# Patient Record
Sex: Female | Born: 1995 | Race: Black or African American | Hispanic: No | Marital: Single | Smoking: Never smoker
Health system: Southern US, Community
[De-identification: ages and names within clinical notes are randomized; demographics above are authoritative.]

## PROBLEM LIST (undated history)

## (undated) DIAGNOSIS — T7840XA Allergy, unspecified, initial encounter: Secondary | ICD-10-CM

## (undated) HISTORY — DX: Allergy, unspecified, initial encounter: T78.40XA

## (undated) HISTORY — PX: APPENDECTOMY: SHX54

---
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
## 2013-02-20 ENCOUNTER — Encounter: Payer: Self-pay | Admitting: Internal Medicine

## 2013-02-20 ENCOUNTER — Ambulatory Visit (INDEPENDENT_AMBULATORY_CARE_PROVIDER_SITE_OTHER): Payer: BC Managed Care – PPO | Admitting: Internal Medicine

## 2013-02-20 VITALS — BP 110/70 | HR 70 | Temp 98.0°F | Resp 18 | Wt 163.0 lb

## 2013-02-20 DIAGNOSIS — B009 Herpesviral infection, unspecified: Secondary | ICD-10-CM

## 2013-02-20 DIAGNOSIS — B001 Herpesviral vesicular dermatitis: Secondary | ICD-10-CM

## 2013-02-20 DIAGNOSIS — L089 Local infection of the skin and subcutaneous tissue, unspecified: Secondary | ICD-10-CM | POA: Insufficient documentation

## 2013-02-20 MED ORDER — DOXYCYCLINE HYCLATE 100 MG PO TABS: 100.0000 mg | ORAL_TABLET | Freq: Two times a day (BID) | ORAL | Status: DC

## 2013-02-20 MED ORDER — VALACYCLOVIR HCL 1 G PO TABS: 1000.0000 mg | ORAL_TABLET | Freq: Two times a day (BID) | ORAL | Status: DC

## 2013-02-20 NOTE — Progress Notes (Signed)
Pre-visit discussion using our clinic review tool. No additional management support is needed unless otherwise documented below in the visit note.  

## 2013-02-20 NOTE — Patient Instructions (Signed)
Please take all new medication as prescribed - the two antibiotics (valtrex, and doxycycline) Please continue all other medications as before, and refills have been done if requested. Please have the pharmacy call with any other refills you may need.  Please remember to sign up for My Chart if you have not done so, as this will be important to you in the future with finding out test results, communicating by private email, and scheduling acute appointments online when needed.

## 2013-02-21 NOTE — Assessment & Plan Note (Signed)
Mild to mod, for antibx course,  to f/u any worsening symptoms or concerns 

## 2013-02-21 NOTE — Progress Notes (Signed)
   Subjective:    Patient ID: Lauren Mcknight, female    DOB: 06/06/1995, 18 y.o.   MRN: [id number]  HPI  Here with 3 days onset several pustules right cheek tender with one small drained, no fever, HA, ST, cough and Pt denies chest pain, increased sob or doe, wheezing, orthopnea, PND, increased LE swelling, palpitations, dizziness or syncope.  Also recently with several new tingly vesicular cold sore lesions to right corner of mouth as well. Pt denies new neurological symptoms such as new headache, or facial or extremity weakness or numbness  Pt denies polydipsia, polyuria. Past Medical History  Diagnosis Date  . ALLERGIC REACTION 07/20/2008    Qualifier: Diagnosis of  By: Shary DecampHawks, Stacia    . Allergy    Past Surgical History  Procedure Laterality Date  . Breast lumpectomy  2011    reports that she has never smoked. She has never used smokeless tobacco. She reports that she does not drink alcohol or use illicit drugs. family history includes Diabetes in her mother. There is no history of Alcohol abuse, Cancer, Heart disease, Hyperlipidemia, Hypertension, or Stroke. No Known Allergies Current Outpatient Prescriptions on File Prior to Visit  Medication Sig Dispense Refill  . clotrimazole-betamethasone (LOTRISONE) cream Use as directed twice per day as needed  15 g  1  . EPINEPHrine (EPI-PEN) 0.3 mg/0.3 mL DEVI Inject 0.3 mLs (0.3 mg total) into the muscle once.  1 Device  1  . etonogestrel-ethinyl estradiol (NUVARING) 0.12-0.015 MG/24HR vaginal ring Place 1 each vaginally every 28 (twenty-eight) days. Insert vaginally and leave in place for 3 consecutive weeks, then remove for 1 week.       No current facility-administered medications on file prior to visit.   Review of Systems  Constitutional: Negative for unexpected weight change, or unusual diaphoresis  HENT: Negative for tinnitus.   Eyes: Negative for photophobia and visual disturbance.  Respiratory: Negative for choking and stridor.     Gastrointestinal: Negative for vomiting and blood in stool.  Genitourinary: Negative for hematuria and decreased urine volume.  Musculoskeletal: Negative for acute joint swelling Skin: Negative for color change and wound.  Neurological: Negative for tremors and numbness other than noted  Psychiatric/Behavioral: Negative for decreased concentration or  hyperactivity.       Objective:   Physical Exam BP 110/70  Pulse 70  Temp(Src) 98 F (36.7 C) (Oral)  Resp 18  Wt 163 lb (73.936 kg)  SpO2 99% VS noted, mild ill Constitutional: Pt appears well-developed and well-nourished.  HENT: Head: NCAT.  Right Ear: External ear normal.  Left Ear: External ear normal.  Eyes: Conjunctivae and EOM are normal. Pupils are equal, round, and reactive to light.  Neck: Normal range of motion. Neck supple.  Cardiovascular: Normal rate and regular rhythm.   Pulmonary/Chest: Effort normal and breath sounds normal.  Neurological: Pt is alert. Not confused  Skin: right cheek with two 1/2 cm each areas pustulonodular tender without fluctuance or drainage, also several grouped cold sores vesiclular to right corner of mouth Psychiatric: Pt behavior is normal. Thought content normal.     Assessment & Plan:

## 2013-03-31 ENCOUNTER — Ambulatory Visit (INDEPENDENT_AMBULATORY_CARE_PROVIDER_SITE_OTHER)
Admission: RE | Admit: 2013-03-31 | Discharge: 2013-03-31 | Disposition: A | Discharge status: Home / Self Care | Payer: BC Managed Care – PPO | Source: Ambulatory Visit | Attending: Internal Medicine | Admitting: Internal Medicine

## 2013-03-31 ENCOUNTER — Encounter: Payer: Self-pay | Admitting: Internal Medicine

## 2013-03-31 ENCOUNTER — Ambulatory Visit (INDEPENDENT_AMBULATORY_CARE_PROVIDER_SITE_OTHER): Payer: BC Managed Care – PPO | Admitting: Internal Medicine

## 2013-03-31 VITALS — BP 112/80 | HR 97 | Temp 98.6°F | Ht 65.5 in | Wt 166.1 lb

## 2013-03-31 DIAGNOSIS — M25579 Pain in unspecified ankle and joints of unspecified foot: Secondary | ICD-10-CM

## 2013-03-31 DIAGNOSIS — M25572 Pain in left ankle and joints of left foot: Secondary | ICD-10-CM

## 2013-03-31 MED ORDER — NAPROXEN 500 MG PO TABS: 500.0000 mg | ORAL_TABLET | Freq: Two times a day (BID) | ORAL | Status: DC

## 2013-03-31 NOTE — Assessment & Plan Note (Signed)
C/w sprain, cant r/o fx - for naproxen prn, check film - r/o fx

## 2013-03-31 NOTE — Patient Instructions (Signed)
Please take all new medication as prescribed Please continue all other medications as before, and refills have been done if requested.  Please go to the XRAY Department in the Basement (go straight as you get off the elevator) for the x-ray testing You will be contacted by phone if any changes need to be made immediately.  Otherwise, you will receive a letter about your results with an explanation, but please check with MyChart first.

## 2013-03-31 NOTE — Progress Notes (Signed)
   Subjective:    Patient ID: Lauren Mcknight, female    DOB: 03/25/1995, 18 y.o.   MRN: [id number]  HPI Here to f/u, unfort was walking in a yard in high heels and turned left ankle, with foot inversion 1 wk ago, but with persistent pain, swelling, and some bruising persisting, just not seeming to get better, despite initial icing. Past Medical History  Diagnosis Date  . ALLERGIC REACTION 07/20/2008    Qualifier: Diagnosis of  By: Shary DecampHawks, Stacia    . Allergy    Past Surgical History  Procedure Laterality Date  . Breast lumpectomy  2011    reports that she has never smoked. She has never used smokeless tobacco. She reports that she does not drink alcohol or use illicit drugs. family history includes Diabetes in her mother. There is no history of Alcohol abuse, Cancer, Heart disease, Hyperlipidemia, Hypertension, or Stroke. No Known Allergies Current Outpatient Prescriptions on File Prior to Visit  Medication Sig Dispense Refill  . etonogestrel-ethinyl estradiol (NUVARING) 0.12-0.015 MG/24HR vaginal ring Place 1 each vaginally every 28 (twenty-eight) days. Insert vaginally and leave in place for 3 consecutive weeks, then remove for 1 week.      . clotrimazole-betamethasone (LOTRISONE) cream Use as directed twice per day as needed  15 g  1  . EPINEPHrine (EPI-PEN) 0.3 mg/0.3 mL DEVI Inject 0.3 mLs (0.3 mg total) into the muscle once.  1 Device  1  . valACYclovir (VALTREX) 1000 MG tablet Take 1 tablet (1,000 mg total) by mouth 2 (two) times daily.  20 tablet  0   No current facility-administered medications on file prior to visit.   Review of Systems All otherwise neg per pt     Objective:   Physical Exam BP 112/80  Pulse 97  Temp(Src) 98.6 F (37 C) (Oral)  Ht 5' 5.5" (1.664 m)  Wt 166 lb 2 oz (75.354 kg)  BMI 27.21 kg/m2  SpO2 97%  LMP 03/16/2013 VS noted, not ill appearing Constitutional: Pt appears well-developed and well-nourished. Lavella Lemons/obese HENT: Head: NCAT.  Right Ear:  External ear normal.  Left Ear: External ear normal.  Eyes: Conjunctivae and EOM are normal. Pupils are equal, round, and reactive to light.  Neck: Normal range of motion. Neck supple.  Cardiovascular: Normal rate and regular rhythm.   Pulmonary/Chest: Effort normal and breath sounds normal.  Neurological: Pt is alert. Not confused  Left ankle with 1-2+swelling, tender, brusing about the left lateral malleolus, o/w neurovasc intact Psychiatric: Pt behavior is normal. Thought content normal.     Assessment & Plan:

## 2013-03-31 NOTE — Progress Notes (Signed)
Pre-visit discussion using our clinic review tool. No additional management support is needed unless otherwise documented below in the visit note.  

## 2013-06-28 ENCOUNTER — Encounter (INDEPENDENT_AMBULATORY_CARE_PROVIDER_SITE_OTHER): Payer: Self-pay | Admitting: General Surgery

## 2013-06-28 ENCOUNTER — Ambulatory Visit (INDEPENDENT_AMBULATORY_CARE_PROVIDER_SITE_OTHER): Payer: BC Managed Care – PPO | Admitting: General Surgery

## 2013-06-28 VITALS — BP 132/72 | HR 80 | Temp 97.2°F | Resp 12 | Ht 66.0 in | Wt 169.4 lb

## 2013-06-28 DIAGNOSIS — IMO0002 Reserved for concepts with insufficient information to code with codable children: Secondary | ICD-10-CM

## 2013-06-28 DIAGNOSIS — N62 Hypertrophy of breast: Secondary | ICD-10-CM

## 2013-06-28 NOTE — Progress Notes (Signed)
Patient ID: Lauren Mcknight, female   DOB: 06/16/1995, 18 y.o.   MRN: [id number]  Chief Complaint  Patient presents with  . New Evaluation    Eval fibrocystic br    HPI Lauren Mcknight is a 18 y.o. female.  We're asked to see the patient in consultation by Dr. Oliver BarreJames John to evaluate her for breast enlargement. 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 Since that time she has had progressive enlargement of both breasts. She has had trouble getting brawl as to fit. She is starting to have pain in her back. She is also developed a rash near her areola which she has seen a dermatologist for.  HPI  Past Medical History  Diagnosis Date  . ALLERGIC REACTION 07/20/2008    Qualifier: Diagnosis of  By: Shary DecampHawks, Stacia    . Allergy     Past Surgical History  Procedure Laterality Date  . Breast lumpectomy  2011    Family History  Problem Relation Age of Onset  . Diabetes Mother   . Alcohol abuse Neg Hx   . Cancer Neg Hx   . Heart disease Neg Hx   . Hyperlipidemia Neg Hx   . Hypertension Neg Hx   . Stroke Neg Hx     Social History History  Substance Use Topics  . Smoking status: Never Smoker   . Smokeless tobacco: Never Used  . Alcohol Use: No    No Known Allergies  Current Outpatient Prescriptions  Medication Sig Dispense Refill  . desoximetasone (TOPICORT) 0.05 % cream       . EPINEPHrine (EPI-PEN) 0.3 mg/0.3 mL DEVI Inject 0.3 mLs (0.3 mg total) into the muscle once.  1 Device  1  . etonogestrel-ethinyl estradiol (NUVARING) 0.12-0.015 MG/24HR vaginal ring Place 1 each vaginally every 28 (twenty-eight) days. Insert vaginally and leave in place for 3 consecutive weeks, then remove for 1 week.      . fluconazole (DIFLUCAN) 150 MG tablet       . Iron-Folic Acid-Vit B12 (IRON FORMULA PO) Take by mouth.      . valACYclovir (VALTREX) 1000 MG tablet Take 1 tablet (1,000 mg total) by mouth 2 (two) times daily.  20 tablet  0  .  clotrimazole-betamethasone (LOTRISONE) cream Use as directed twice per day as needed  15 g  1   No current facility-administered medications for this visit.    Review of Systems Review of Systems  Constitutional: Negative.   HENT: Negative.   Eyes: Negative.   Respiratory: Negative.   Cardiovascular: Negative.   Gastrointestinal: Negative.   Endocrine: Negative.   Genitourinary: Negative.   Musculoskeletal: Positive for back pain.  Skin: Negative.   Allergic/Immunologic: Negative.   Neurological: Negative.   Hematological: Negative.   Psychiatric/Behavioral: Negative.     Blood pressure 132/72, pulse 80, temperature 97.2 F (36.2 C), temperature source Temporal, resp. rate 12, height 5\' 6"  (1.676 m), weight 169 lb 6.4 oz (76.839 kg).  Physical Exam Physical Exam  Constitutional: She is oriented to person, place, and time. She appears well-developed and well-nourished.  HENT:  Head: Normocephalic and atraumatic.  Eyes: Conjunctivae and EOM are normal. Pupils are equal, round, and reactive to light.  Neck: Normal range of motion. Neck supple.  Cardiovascular: Normal rate, regular rhythm and normal heart sounds.   Pulmonary/Chest: Effort normal and breath sounds normal.  There is no palpable mass in either breast. There is no palpable axillary,  supraclavicular, or cervical lymphadenopathy  Abdominal: Soft. Bowel sounds are normal.  Musculoskeletal: Normal range of motion.  Lymphadenopathy:    She has no cervical adenopathy.  Neurological: She is alert and oriented to person, place, and time.  Skin: Skin is warm and dry.  Psychiatric: She has a normal mood and affect. Her behavior is normal.    Data Reviewed As above  Assessment    The patient has had progressive enlargement of birth breasts over the last several years. It seems as though the weight of her breasts is starting to cause her other problems such as back pain. For this reason she is interested in having a  breast reduction.     Plan    At this point I will refer her to the plastic surgeons to talk about options for reduction. She will continue to do regular self exams. I will plan to see her back on a when necessary basis.        Caleen Essexaul S Toth III 06/28/2013, 1:32 PM

## 2013-06-28 NOTE — Patient Instructions (Signed)
Will refer to plastics

## 2013-07-02 DIAGNOSIS — N62 Hypertrophy of breast: Secondary | ICD-10-CM | POA: Insufficient documentation

## 2013-11-04 ENCOUNTER — Other Ambulatory Visit: Payer: Self-pay | Admitting: Obstetrics and Gynecology

## 2013-11-05 LAB — CYTOLOGY - PAP

## 2013-12-29 ENCOUNTER — Ambulatory Visit (INDEPENDENT_AMBULATORY_CARE_PROVIDER_SITE_OTHER): Payer: BC Managed Care – PPO | Admitting: Internal Medicine

## 2013-12-29 ENCOUNTER — Encounter: Payer: Self-pay | Admitting: Internal Medicine

## 2013-12-29 VITALS — BP 102/80 | HR 102 | Temp 98.2°F | Wt 183.0 lb

## 2013-12-29 DIAGNOSIS — J069 Acute upper respiratory infection, unspecified: Secondary | ICD-10-CM | POA: Insufficient documentation

## 2013-12-29 MED ORDER — AZITHROMYCIN 250 MG PO TABS: ORAL_TABLET | ORAL | Status: DC

## 2013-12-29 NOTE — Assessment & Plan Note (Signed)
Mild to mod, for antibx course,  to f/u any worsening symptoms or concerns 

## 2013-12-29 NOTE — Patient Instructions (Signed)
Please take all new medication as prescribed - the antibiotic  Please continue all other medications as before, and refills have been done if requested.  Please have the pharmacy call with any other refills you may need.  Please keep your appointments with your specialists as you may have planned   

## 2013-12-29 NOTE — Progress Notes (Signed)
Pre visit review using our clinic review tool, if applicable. No additional management support is needed unless otherwise documented below in the visit note. 

## 2013-12-29 NOTE — Progress Notes (Signed)
   Subjective:    Patient ID: Lauren Mcknight, female    DOB: 09/22/1995, 18 y.o.   MRN: [id number]  HPI   Here with 2-3 days acute onset fever, facial pain, pressure, headache, general weakness and malaise, and greenish d/c, with mild ST and cough, but pt denies chest pain, wheezing, increased sob or doe, orthopnea, PND, increased LE swelling, palpitations, dizziness or syncope.  Past Medical History  Diagnosis Date  . ALLERGIC REACTION 07/20/2008    Qualifier: Diagnosis of  By: Shary DecampHawks, Stacia    . Allergy    Past Surgical History  Procedure Laterality Date  . Breast lumpectomy  2011    reports that she has never smoked. She has never used smokeless tobacco. She reports that she does not drink alcohol or use illicit drugs. family history includes Diabetes in her mother. There is no history of Alcohol abuse, Cancer, Heart disease, Hyperlipidemia, Hypertension, or Stroke. No Known Allergies Current Outpatient Prescriptions on File Prior to Visit  Medication Sig Dispense Refill  . clotrimazole-betamethasone (LOTRISONE) cream Use as directed twice per day as needed 15 g 1  . desoximetasone (TOPICORT) 0.05 % cream     . EPINEPHrine (EPI-PEN) 0.3 mg/0.3 mL DEVI Inject 0.3 mLs (0.3 mg total) into the muscle once. 1 Device 1  . etonogestrel-ethinyl estradiol (NUVARING) 0.12-0.015 MG/24HR vaginal ring Place 1 each vaginally every 28 (twenty-eight) days. Insert vaginally and leave in place for 3 consecutive weeks, then remove for 1 week.    . fluconazole (DIFLUCAN) 150 MG tablet     . Iron-Folic Acid-Vit B12 (IRON FORMULA PO) Take by mouth.    . valACYclovir (VALTREX) 1000 MG tablet Take 1 tablet (1,000 mg total) by mouth 2 (two) times daily. 20 tablet 0   No current facility-administered medications on file prior to visit.   Review of Systems  Constitutional: Negative for unusual diaphoresis or other sweats  HENT: Negative for ringing in ear Eyes: Negative for double vision or worsening visual  disturbance.  Respiratory: Negative for choking and stridor.   Gastrointestinal: Negative for vomiting or other signifcant bowel change Genitourinary: Negative for hematuria or decreased urine volume.  Musculoskeletal: Negative for other MSK pain or swelling Skin: Negative for color change and worsening wound.  Neurological: Negative for tremors and numbness other than noted  Psychiatric/Behavioral: Negative for decreased concentration or agitation other than above       Objective:   Physical Exam BP 102/80 mmHg  Pulse 102  Temp(Src) 98.2 F (36.8 C) (Oral)  Wt 183 lb (83.008 kg)  SpO2 96% VS noted,  Constitutional: Pt appears well-developed, well-nourished.  HENT: Head: NCAT.  Right Ear: External ear normal.  Left Ear: External ear normal.  Bilat tm's with mild erythema.  Max sinus areas mild tender.  Pharynx with mild erythema, no exudate Eyes: . Pupils are equal, round, and reactive to light. Conjunctivae and EOM are normal Neck: Normal range of motion. Neck supple.  Cardiovascular: Normal rate and regular rhythm.   Pulmonary/Chest: Effort normal and breath sounds normal.  Neurological: Pt is alert. Not confused , motor grossly intact Skin: Skin is warm. No rash Psychiatric: Pt behavior is normal. No agitation.     Assessment & Plan:

## 2014-01-03 ENCOUNTER — Telehealth: Payer: Self-pay | Admitting: Internal Medicine

## 2014-01-03 MED ORDER — PROMETHAZINE-DM 6.25-15 MG/5ML PO SYRP: 5.0000 mL | ORAL_SOLUTION | Freq: Four times a day (QID) | ORAL | Status: DC | PRN

## 2014-01-03 NOTE — Telephone Encounter (Signed)
done

## 2014-01-03 NOTE — Telephone Encounter (Signed)
Pt called stated that she was in to see Dr. Jonny RuizJohn for upper respiratory infection and Dr. Jonny RuizJohn gave her Z-pak. Pt thought she ask Dr. Jonny RuizJohn for tussionex for her cough. Advise pt that Dr. Jonny RuizJohn is not today but she request for us to ask another doctor here because she really needed something by tonight for this cough. Please help.

## 2014-04-14 ENCOUNTER — Ambulatory Visit (INDEPENDENT_AMBULATORY_CARE_PROVIDER_SITE_OTHER): Payer: BLUE CROSS/BLUE SHIELD | Admitting: Internal Medicine

## 2014-04-14 ENCOUNTER — Encounter: Payer: Self-pay | Admitting: Internal Medicine

## 2014-04-14 VITALS — BP 124/78 | HR 90 | Temp 99.1°F | Resp 18 | Ht 66.0 in | Wt 175.1 lb

## 2014-04-14 DIAGNOSIS — M545 Low back pain, unspecified: Secondary | ICD-10-CM | POA: Insufficient documentation

## 2014-04-14 MED ORDER — CYCLOBENZAPRINE HCL 5 MG PO TABS: 5.0000 mg | ORAL_TABLET | Freq: Three times a day (TID) | ORAL | Status: DC | PRN

## 2014-04-14 MED ORDER — NAPROXEN 500 MG PO TABS: 500.0000 mg | ORAL_TABLET | Freq: Two times a day (BID) | ORAL | Status: DC

## 2014-04-14 NOTE — Assessment & Plan Note (Addendum)
C/w msk strain vs underlying djd/DDD (less likely), for nsaid prn, muscle relacer prn,  to f/u any worsening symptoms or concerns, for films r/o spondyloarthopathy

## 2014-04-14 NOTE — Patient Instructions (Signed)
Please take all new medication as prescribed - the anti-inflammatory, and muscled relaxer as needed  Please continue all other medications as before, and refills have been done if requested.  Please have the pharmacy call with any other refills you may need.  Please continue your efforts at being more active, low cholesterol diet, and weight control.  Please keep your appointments with your specialists as you may have planned  Please go to the XRAY Department in the Basement (go straight as you get off the elevator) for the x-ray testing tomorrow  You will be contacted by phone if any changes need to be made immediately.  Otherwise, you will receive a letter about your results with an explanation, but please check with MyChart first.  Please remember to sign up for MyChart if you have not done so, as this will be important to you in the future with finding out test results, communicating by private email, and scheduling acute appointments online when needed.

## 2014-04-14 NOTE — Progress Notes (Signed)
Pre visit review using our clinic review tool, if applicable. No additional management support is needed unless otherwise documented below in the visit note. 

## 2014-04-14 NOTE — Progress Notes (Signed)
Subjective:    Patient ID: Lauren Mcknight, female    DOB: 1995-12-29, 19 y.o.   MRN: [id number]  HPI   Pt continues to have recurring mild left LBP without change in severity, bowel or bladder change, fever, wt loss,  worsening LE pain/numbness/weakness, gait change or falls. Denies urinary symptoms such as dysuria, frequency, urgency, flank pain, hematuria or n/v, fever, chills.Also c/o small itchy erythem lesions recurring to breasts, has seen derm, better with steroid creeam.   Past Medical History  Diagnosis Date  . ALLERGIC REACTION 07/20/2008    Qualifier: Diagnosis of  By: Shary Decamp    . Allergy    Past Surgical History  Procedure Laterality Date  . Breast lumpectomy  2011    reports that she has never smoked. She has never used smokeless tobacco. She reports that she does not drink alcohol or use illicit drugs. family history includes Diabetes in her mother. There is no history of Alcohol abuse, Cancer, Heart disease, Hyperlipidemia, Hypertension, or Stroke. No Known Allergies Current Outpatient Prescriptions on File Prior to Visit  Medication Sig Dispense Refill  . clotrimazole-betamethasone (LOTRISONE) cream Use as directed twice per day as needed 15 g 1  . desoximetasone (TOPICORT) 0.05 % cream     . EPINEPHrine (EPI-PEN) 0.3 mg/0.3 mL DEVI Inject 0.3 mLs (0.3 mg total) into the muscle once. 1 Device 1  . etonogestrel-ethinyl estradiol (NUVARING) 0.12-0.015 MG/24HR vaginal ring Place 1 each vaginally every 28 (twenty-eight) days. Insert vaginally and leave in place for 3 consecutive weeks, then remove for 1 week.    . Iron-Folic Acid-Vit B12 (IRON FORMULA PO) Take by mouth.    . valACYclovir (VALTREX) 1000 MG tablet Take 1 tablet (1,000 mg total) by mouth 2 (two) times daily. 20 tablet 0   No current facility-administered medications on file prior to visit.    Review of Systems  Constitutional: Negative for unusual diaphoresis or other sweats  HENT: Negative for ringing  in ear Eyes: Negative for double vision or worsening visual disturbance.  Respiratory: Negative for choking and stridor.   Gastrointestinal: Negative for vomiting or other signifcant bowel change Genitourinary: Negative for hematuria or decreased urine volume.  Musculoskeletal: Negative for other MSK pain or swelling Skin: Negative for color change and worsening wound.  Neurological: Negative for tremors and numbness other than noted  Psychiatric/Behavioral: Negative for decreased concentration or agitation other than above       Objective:   Physical Exam BP 124/78 mmHg  Pulse 90  Temp(Src) 99.1 F (37.3 C) (Oral)  Resp 18  Ht  (1.676 m)  Wt 175 lb 1.9 oz (79.434 kg)  BMI 28.28 kg/m2  SpO2 96%  LMP 04/14/2014 VS noted,  Constitutional: Pt appears well-developed, well-nourished.  HENT: Head: NCAT.  Right Ear: External ear normal.  Left Ear: External ear normal.  Eyes: . Pupils are equal, round, and reactive to light. Conjunctivae and EOM are normal Neck: Normal range of motion. Neck supple.  Cardiovascular: Normal rate and regular rhythm.   Pulmonary/Chest: Effort normal and breath sounds without rales or wheezing.  Abd:  Soft, NT, ND, + BS Spine nontender, , no overt paravertebral spasm, swelling or rash Neurological: Pt is alert. Not confused , motor grossly intact Skin: Skin is warm. No rash Psychiatric: Pt behavior is normal. No agitation.  Wt Readings from Last 3 Encounters:  04/14/14 175 lb 1.9 oz (79.434 kg)  12/29/13 183 lb (83.008 kg)  06/28/13 169 lb 6.4 oz (76.839  kg) (92 %*, Z = 1.38)   * Growth percentiles are based on CDC 2-20 Years data.        Assessment & Plan:

## 2014-04-15 ENCOUNTER — Ambulatory Visit (INDEPENDENT_AMBULATORY_CARE_PROVIDER_SITE_OTHER)
Admission: RE | Admit: 2014-04-15 | Discharge: 2014-04-15 | Disposition: A | Discharge status: Home / Self Care | Payer: BLUE CROSS/BLUE SHIELD | Source: Ambulatory Visit | Attending: Internal Medicine | Admitting: Internal Medicine

## 2014-04-15 DIAGNOSIS — M545 Low back pain, unspecified: Secondary | ICD-10-CM

## 2014-04-19 ENCOUNTER — Encounter: Payer: Self-pay | Admitting: Internal Medicine

## 2014-04-25 ENCOUNTER — Other Ambulatory Visit: Payer: Self-pay | Admitting: Obstetrics and Gynecology

## 2014-04-26 LAB — CYTOLOGY - PAP

## 2014-09-12 ENCOUNTER — Other Ambulatory Visit: Payer: Self-pay | Admitting: Emergency Medicine

## 2014-09-12 ENCOUNTER — Ambulatory Visit (INDEPENDENT_AMBULATORY_CARE_PROVIDER_SITE_OTHER): Payer: BLUE CROSS/BLUE SHIELD | Admitting: Internal Medicine

## 2014-09-12 ENCOUNTER — Encounter: Payer: Self-pay | Admitting: Internal Medicine

## 2014-09-12 ENCOUNTER — Telehealth: Payer: Self-pay | Admitting: Internal Medicine

## 2014-09-12 ENCOUNTER — Encounter: Payer: Self-pay | Admitting: Emergency Medicine

## 2014-09-12 VITALS — BP 118/78 | HR 73 | Temp 98.2°F | Resp 14 | Wt 172.0 lb

## 2014-09-12 DIAGNOSIS — J209 Acute bronchitis, unspecified: Secondary | ICD-10-CM | POA: Diagnosis not present

## 2014-09-12 DIAGNOSIS — J069 Acute upper respiratory infection, unspecified: Secondary | ICD-10-CM

## 2014-09-12 MED ORDER — HYDROCOD POLST-CPM POLST ER 10-8 MG/5ML PO SUER: 5.0000 mL | Freq: Two times a day (BID) | ORAL | Status: DC | PRN

## 2014-09-12 MED ORDER — AMOXICILLIN 500 MG PO CAPS: 500.0000 mg | ORAL_CAPSULE | Freq: Three times a day (TID) | ORAL | Status: DC

## 2014-09-12 NOTE — Progress Notes (Signed)
Pre visit review using our clinic review tool, if applicable. No additional management support is needed unless otherwise documented below in the visit note. 

## 2014-09-12 NOTE — Telephone Encounter (Signed)
Patient needs her 2 prescriptions that were written today to be sent to [facility] on [address]

## 2014-09-12 NOTE — Patient Instructions (Signed)

## 2014-09-12 NOTE — Progress Notes (Signed)
   Subjective:    Patient ID: Lauren Mcknight, female    DOB: [birth year], 19 y.o.   MRN: [id number]  HPI Her symptoms began 09/08/14 as a sore throat and prolific rhinitis with light yellow/green discharge. There was associated nasal obstruction. She employed Mucinex, Tylenol, NyQuil with some improvement in her symptoms  Over the next 3 days she developed throat congestion with nonproductive cough associated with some shortness of breath.  She had itchy, watery eyes, and sneezing for which she took loratadine.  She had temperature up to 101 associated with chills and sweats.  She's never smoked and has no history of asthma.   Review of Systems The symptoms were not associated with wheezing.  She had no otic discharge. There has been no associated dental pain.    Objective:   Physical Exam  General appearance:Adequately nourished; no acute distress or increased work of breathing is present.    Lymphatic: No  lymphadenopathy about the head, neck, or axilla .  Eyes: No conjunctival inflammation or lid edema is present. There is no scleral icterus.  Ears:  External ear exam shows no significant lesions or deformities.  Otoscopic examination reveals clear canals, tympanic membranes are intact bilaterally without bulging, retraction, inflammation or discharge. TMs are dull  Nose:  External nasal examination shows no deformity or inflammation. Nasal mucosa are erythematous without lesions or exudates No septal dislocation or deviation.No obstruction to airflow.   Oral exam: Dental hygiene is good; she has full braces. The lips and gums are healthy appearing.There is no oropharyngeal erythema or exudate .  Neck:  No deformities, thyromegaly, masses, or tenderness noted.   Supple with full range of motion without pain.   Heart:  Normal rate and regular rhythm. S1 and S2 normal without gallop, murmur, click, rub or other extra sounds.   Lungs:Chest clear to auscultation; no wheezes,  rhonchi,rales ,or rubs present.  Extremities:  No cyanosis, edema, or clubbing  noted    Skin: Warm & dry w/o tenting .No significant lesions or rash.        Assessment & Plan:  #1 acute bronchitis w/o bronchospasm #2 URI, acute Plan: See orders and recommendations

## 2014-09-13 ENCOUNTER — Ambulatory Visit: Payer: BLUE CROSS/BLUE SHIELD | Admitting: Internal Medicine

## 2015-01-28 ENCOUNTER — Emergency Department (HOSPITAL_COMMUNITY)
Admission: EM | Admit: 2015-01-28 | Discharge: 2015-01-28 | Discharge status: Against Medical Advice | Payer: BLUE CROSS/BLUE SHIELD | Source: Home / Self Care

## 2015-01-28 ENCOUNTER — Encounter (HOSPITAL_COMMUNITY): Payer: Self-pay | Admitting: *Deleted

## 2015-01-28 ENCOUNTER — Emergency Department (HOSPITAL_COMMUNITY): Payer: BLUE CROSS/BLUE SHIELD

## 2015-01-28 ENCOUNTER — Emergency Department (HOSPITAL_COMMUNITY)
Admission: EM | Admit: 2015-01-28 | Discharge: 2015-01-28 | Disposition: A | Discharge status: Home / Self Care | Payer: BLUE CROSS/BLUE SHIELD | Attending: Emergency Medicine | Admitting: Emergency Medicine

## 2015-01-28 DIAGNOSIS — S7012XA Contusion of left thigh, initial encounter: Secondary | ICD-10-CM | POA: Diagnosis not present

## 2015-01-28 DIAGNOSIS — S4992XA Unspecified injury of left shoulder and upper arm, initial encounter: Secondary | ICD-10-CM | POA: Diagnosis not present

## 2015-01-28 DIAGNOSIS — M7918 Myalgia, other site: Secondary | ICD-10-CM

## 2015-01-28 DIAGNOSIS — S60221A Contusion of right hand, initial encounter: Secondary | ICD-10-CM | POA: Diagnosis not present

## 2015-01-28 DIAGNOSIS — S79921A Unspecified injury of right thigh, initial encounter: Secondary | ICD-10-CM | POA: Insufficient documentation

## 2015-01-28 DIAGNOSIS — S79922A Unspecified injury of left thigh, initial encounter: Secondary | ICD-10-CM | POA: Insufficient documentation

## 2015-01-28 DIAGNOSIS — Y998 Other external cause status: Secondary | ICD-10-CM | POA: Insufficient documentation

## 2015-01-28 DIAGNOSIS — S7011XA Contusion of right thigh, initial encounter: Secondary | ICD-10-CM | POA: Diagnosis not present

## 2015-01-28 DIAGNOSIS — Y92410 Unspecified street and highway as the place of occurrence of the external cause: Secondary | ICD-10-CM | POA: Insufficient documentation

## 2015-01-28 DIAGNOSIS — S199XXA Unspecified injury of neck, initial encounter: Secondary | ICD-10-CM | POA: Insufficient documentation

## 2015-01-28 DIAGNOSIS — S4991XA Unspecified injury of right shoulder and upper arm, initial encounter: Secondary | ICD-10-CM | POA: Diagnosis not present

## 2015-01-28 DIAGNOSIS — Y9389 Activity, other specified: Secondary | ICD-10-CM

## 2015-01-28 DIAGNOSIS — S3991XA Unspecified injury of abdomen, initial encounter: Secondary | ICD-10-CM | POA: Diagnosis not present

## 2015-01-28 DIAGNOSIS — S3992XA Unspecified injury of lower back, initial encounter: Secondary | ICD-10-CM | POA: Insufficient documentation

## 2015-01-28 DIAGNOSIS — Z792 Long term (current) use of antibiotics: Secondary | ICD-10-CM | POA: Diagnosis not present

## 2015-01-28 DIAGNOSIS — Z791 Long term (current) use of non-steroidal anti-inflammatories (NSAID): Secondary | ICD-10-CM | POA: Diagnosis not present

## 2015-01-28 DIAGNOSIS — Z79899 Other long term (current) drug therapy: Secondary | ICD-10-CM | POA: Insufficient documentation

## 2015-01-28 DIAGNOSIS — S299XXA Unspecified injury of thorax, initial encounter: Secondary | ICD-10-CM | POA: Insufficient documentation

## 2015-01-28 DIAGNOSIS — S6991XA Unspecified injury of right wrist, hand and finger(s), initial encounter: Secondary | ICD-10-CM | POA: Diagnosis present

## 2015-01-28 MED ORDER — IBUPROFEN 800 MG PO TABS
800.0000 mg | ORAL_TABLET | Freq: Once | ORAL | Status: AC
  Administered 2015-01-28: 800 mg via ORAL
  Filled 2015-01-28: qty 1

## 2015-01-28 MED ORDER — DIAZEPAM 5 MG PO TABS: 5.0000 mg | ORAL_TABLET | Freq: Three times a day (TID) | ORAL | Status: DC | PRN

## 2015-01-28 MED ORDER — IBUPROFEN 800 MG PO TABS: 800.0000 mg | ORAL_TABLET | Freq: Three times a day (TID) | ORAL | Status: DC | PRN

## 2015-01-28 NOTE — ED Notes (Signed)
The pts mother decided to take her home when she heard the wait time
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
## 2015-01-31 ENCOUNTER — Encounter: Payer: Self-pay | Admitting: Internal Medicine

## 2015-01-31 ENCOUNTER — Ambulatory Visit (INDEPENDENT_AMBULATORY_CARE_PROVIDER_SITE_OTHER): Payer: BLUE CROSS/BLUE SHIELD | Admitting: Internal Medicine

## 2015-01-31 VITALS — BP 134/84 | HR 89 | Temp 98.8°F | Ht 66.0 in | Wt 170.0 lb

## 2015-01-31 DIAGNOSIS — M545 Low back pain, unspecified: Secondary | ICD-10-CM

## 2015-01-31 DIAGNOSIS — T07XXXA Unspecified multiple injuries, initial encounter: Secondary | ICD-10-CM

## 2015-01-31 DIAGNOSIS — R079 Chest pain, unspecified: Secondary | ICD-10-CM

## 2015-01-31 DIAGNOSIS — T148 Other injury of unspecified body region: Secondary | ICD-10-CM | POA: Diagnosis not present

## 2015-01-31 MED ORDER — CYCLOBENZAPRINE HCL 5 MG PO TABS: 5.0000 mg | ORAL_TABLET | Freq: Three times a day (TID) | ORAL | Status: DC | PRN

## 2015-01-31 MED ORDER — DICLOFENAC SODIUM 1 % TD GEL: 4.0000 g | Freq: Four times a day (QID) | TRANSDERMAL | Status: DC | PRN

## 2015-01-31 NOTE — Patient Instructions (Signed)
Please take all new medication as prescribed - the topical gel, and the muscle relaser as needed  I would suggest seeing the Chiropracter for the lower back  Please continue all other medications as before, and refills have been done if requested.  Please have the pharmacy call with any other refills you may need.  Please continue your efforts at being more active, low cholesterol diet, and weight control.  Please keep your appointments with your specialists as you may have planned

## 2015-01-31 NOTE — Progress Notes (Signed)
Pre visit review using our clinic review tool, if applicable. No additional management support is needed unless otherwise documented below in the visit note. 
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
## 2015-02-01 ENCOUNTER — Telehealth: Payer: Self-pay

## 2015-02-01 NOTE — Telephone Encounter (Signed)
PA initiated via covermymeds. Key for PA is [id number]

## 2015-02-06 NOTE — Assessment & Plan Note (Signed)
Noted, d/w pt, tylenol prn,  to f/u any worsening symptoms or concerns

## 2015-02-06 NOTE — Assessment & Plan Note (Signed)
C/w msk strain, for tylenol prn,  to f/u any worsening symptoms or concerns

## 2015-02-06 NOTE — Assessment & Plan Note (Addendum)
Mild to mod, c/w strain, no neuro changes, for nsaid/tylenol prn,  to f/u any worsening symptoms or concerns, does not appear to need PT at this time

## 2015-05-25 ENCOUNTER — Ambulatory Visit (INDEPENDENT_AMBULATORY_CARE_PROVIDER_SITE_OTHER)
Admission: RE | Admit: 2015-05-25 | Discharge: 2015-05-25 | Disposition: A | Discharge status: Home / Self Care | Payer: BLUE CROSS/BLUE SHIELD | Source: Ambulatory Visit | Attending: Internal Medicine | Admitting: Internal Medicine

## 2015-05-25 ENCOUNTER — Ambulatory Visit (INDEPENDENT_AMBULATORY_CARE_PROVIDER_SITE_OTHER): Payer: BLUE CROSS/BLUE SHIELD | Admitting: Internal Medicine

## 2015-05-25 ENCOUNTER — Encounter: Payer: Self-pay | Admitting: Internal Medicine

## 2015-05-25 VITALS — BP 106/72 | HR 85 | Temp 98.4°F | Ht 66.0 in | Wt 173.0 lb

## 2015-05-25 DIAGNOSIS — N62 Hypertrophy of breast: Secondary | ICD-10-CM | POA: Insufficient documentation

## 2015-05-25 DIAGNOSIS — Z0000 Encounter for general adult medical examination without abnormal findings: Secondary | ICD-10-CM | POA: Insufficient documentation

## 2015-05-25 DIAGNOSIS — M545 Low back pain, unspecified: Secondary | ICD-10-CM

## 2015-05-25 DIAGNOSIS — Z0001 Encounter for general adult medical examination with abnormal findings: Secondary | ICD-10-CM | POA: Diagnosis not present

## 2015-05-25 DIAGNOSIS — E669 Obesity, unspecified: Secondary | ICD-10-CM | POA: Insufficient documentation

## 2015-05-25 DIAGNOSIS — R6889 Other general symptoms and signs: Secondary | ICD-10-CM | POA: Diagnosis not present

## 2015-05-25 MED ORDER — IBUPROFEN 800 MG PO TABS: 800.0000 mg | ORAL_TABLET | Freq: Three times a day (TID) | ORAL | Status: DC | PRN

## 2015-05-25 NOTE — Assessment & Plan Note (Signed)
Chronic condition with persistent upper back pain likely related, cannot wear bra straps well without incresaed pain, for plastic surgury referral for consideration reduction surgury

## 2015-05-25 NOTE — Patient Instructions (Addendum)
Please take all new medication as prescribed - the anti-inflammatory  Please continue all other medications as before, and refills have been done if requested.  Please have the pharmacy call with any other refills you may need.  Please continue your efforts at being more active, low cholesterol diet, and weight control.  You are otherwise up to date with prevention measures today.  Please keep your appointments with your specialists as you may have planned  You will be contacted regarding the referral for: Plastic surgury (Dr Derryl HarborBower)  Please go to the XRAY Department in the Basement (go straight as you get off the elevator) for the x-ray testing  Please go to the LAB in the Basement (turn left off the elevator) for the tests to be done today  You will be contacted by phone if any changes need to be made immediately.  Otherwise, you will receive a letter about your results with an explanation, but please check with MyChart first.  Please remember to sign up for MyChart if you have not done so, as this will be important to you in the future with finding out test results, communicating by private email, and scheduling acute appointments online when needed.  .Marland Kitchen

## 2015-05-25 NOTE — Progress Notes (Signed)
Pre visit review using our clinic review tool, if applicable. No additional management support is needed unless otherwise documented below in the visit note. 
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
## 2015-05-25 NOTE — Assessment & Plan Note (Signed)
Mild, for cont'd efforts at wt loss with lower calories and incresaed activity

## 2015-05-25 NOTE — Assessment & Plan Note (Signed)
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
## 2015-05-31 ENCOUNTER — Encounter: Payer: Self-pay | Admitting: Internal Medicine

## 2015-07-27 ENCOUNTER — Other Ambulatory Visit: Payer: Self-pay | Admitting: Obstetrics and Gynecology

## 2015-07-27 DIAGNOSIS — N6459 Other signs and symptoms in breast: Secondary | ICD-10-CM

## 2015-07-31 ENCOUNTER — Ambulatory Visit
Admission: RE | Admit: 2015-07-31 | Discharge: 2015-07-31 | Disposition: A | Discharge status: Home / Self Care | Payer: BLUE CROSS/BLUE SHIELD | Source: Ambulatory Visit | Attending: Obstetrics and Gynecology | Admitting: Obstetrics and Gynecology

## 2015-07-31 DIAGNOSIS — N6459 Other signs and symptoms in breast: Secondary | ICD-10-CM

## 2015-08-09 ENCOUNTER — Other Ambulatory Visit: Payer: BLUE CROSS/BLUE SHIELD

## 2015-08-09 DIAGNOSIS — M545 Low back pain, unspecified: Secondary | ICD-10-CM

## 2016-02-22 ENCOUNTER — Ambulatory Visit (INDEPENDENT_AMBULATORY_CARE_PROVIDER_SITE_OTHER): Payer: BLUE CROSS/BLUE SHIELD | Admitting: Internal Medicine

## 2016-02-22 ENCOUNTER — Encounter: Payer: Self-pay | Admitting: Internal Medicine

## 2016-02-22 ENCOUNTER — Ambulatory Visit (HOSPITAL_COMMUNITY)
Admission: RE | Admit: 2016-02-22 | Discharge: 2016-02-22 | Disposition: A | Discharge status: Home / Self Care | Payer: BLUE CROSS/BLUE SHIELD | Source: Ambulatory Visit | Attending: Internal Medicine | Admitting: Internal Medicine

## 2016-02-22 VITALS — BP 120/70 | HR 67 | Temp 98.6°F | Resp 20 | Wt 180.0 lb

## 2016-02-22 DIAGNOSIS — M25572 Pain in left ankle and joints of left foot: Secondary | ICD-10-CM

## 2016-02-22 MED ORDER — TRAMADOL HCL 50 MG PO TABS: 50.0000 mg | ORAL_TABLET | Freq: Three times a day (TID) | ORAL | 0 refills | Status: DC | PRN

## 2016-02-22 NOTE — Progress Notes (Signed)
Subjective:    Patient ID: Lauren Mcknight, female    DOB: 1995-12-03, 21 y.o.   MRN: [id number]  HPI  Here with persistent 1 wk left lateral ankle pain, swelling and bruising after severe sprain on vacation in [place], used ice off and non for several days but still hobbling and painful, felt a pop when it occurred in over 3 inch heels, no other injury.  Asks for film to r/o fx.  Pt denies chest pain, increased sob or doe, wheezing, orthopnea, PND, increased LE swelling, palpitations, dizziness or syncope. Past Medical History:  Diagnosis Date  . ALLERGIC REACTION 07/20/2008   Qualifier: Diagnosis of  By: Shary Decamp    . Allergy    Past Surgical History:  Procedure Laterality Date  . BREAST LUMPECTOMY  2011    reports that she has never smoked. She has never used smokeless tobacco. She reports that she does not drink alcohol or use drugs. family history includes Diabetes in her mother. No Known Allergies Current Outpatient Prescriptions on File Prior to Visit  Medication Sig Dispense Refill  . amoxicillin (AMOXIL) 500 MG capsule Take 1 capsule (500 mg total) by mouth 3 (three) times daily. (Patient not taking: Reported on 01/31/2015) 21 capsule 0  . chlorpheniramine-HYDROcodone (TUSSIONEX PENNKINETIC ER) 10-8 MG/5ML SUER Take 5 mLs by mouth every 12 (twelve) hours as needed for cough. (Patient not taking: Reported on 01/31/2015) 115 mL 0  . clotrimazole-betamethasone (LOTRISONE) cream Use as directed twice per day as needed (Patient not taking: Reported on 05/25/2015) 15 g 1  . cyclobenzaprine (FLEXERIL) 5 MG tablet Take 1 tablet (5 mg total) by mouth 3 (three) times daily as needed for muscle spasms. 60 tablet 1  . desoximetasone (TOPICORT) 0.05 % cream Reported on 05/25/2015    . diazepam (VALIUM) 5 MG tablet Take 1 tablet (5 mg total) by mouth every 8 (eight) hours as needed for anxiety or muscle spasms. (Patient not taking: Reported on 05/25/2015) 12 tablet 0  . diclofenac sodium  (VOLTAREN) 1 % GEL Apply 4 g topically 4 (four) times daily as needed. (Patient not taking: Reported on 05/25/2015) 400 g 1  . EPINEPHrine (EPI-PEN) 0.3 mg/0.3 mL DEVI Inject 0.3 mLs (0.3 mg total) into the muscle once. (Patient not taking: Reported on 05/25/2015) 1 Device 1  . etonogestrel-ethinyl estradiol (NUVARING) 0.12-0.015 MG/24HR vaginal ring Place 1 each vaginally every 28 (twenty-eight) days. Insert vaginally and leave in place for 3 consecutive weeks, then remove for 1 week.    Marland Kitchen ibuprofen (ADVIL,MOTRIN) 800 MG tablet Take 1 tablet (800 mg total) by mouth every 8 (eight) hours as needed for mild pain or moderate pain. 60 tablet 1  . Iron-Folic Acid-Vit B12 (IRON FORMULA PO) Take by mouth. Reported on 05/25/2015    . naproxen (NAPROSYN) 500 MG tablet Take 1 tablet (500 mg total) by mouth 2 (two) times daily with a meal. (Patient not taking: Reported on 05/25/2015) 60 tablet 2  . valACYclovir (VALTREX) 1000 MG tablet Take 1 tablet (1,000 mg total) by mouth 2 (two) times daily. 20 tablet 0   No current facility-administered medications on file prior to visit.    Review of Systems All otherwise neg per pt     Objective:   Physical Exam BP 120/70   Pulse 67   Temp 98.6 F (37 C) (Oral)   Resp 20   Wt 180 lb (81.6 kg)   LMP 02/15/2016   SpO2 98%   BMI 29.05 kg/m  VS noted,  Constitutional: Pt appears in no apparent distress HENT: Head: NCAT.  Right Ear: External ear normal.  Left Ear: External ear normal.  Eyes: . Pupils are equal, round, and reactive to light. Conjunctivae and EOM are normal Neck: Normal range of motion. Neck supple.  Cardiovascular: Normal rate and regular rhythm.   Pulmonary/Chest: Effort normal and breath sounds without rales or wheezing.  Left ankle with 1-2+ swelling, tender, bruising about the lateral malleolus and lateral dorsal foot Neurological: Pt is alert. Not confused , motor grossly intact Skin: Skin is warm. No rash, no LE edema Psychiatric: Pt  behavior is normal. No agitation.  No other new exam findgins    Assessment & Plan:

## 2016-02-22 NOTE — Assessment & Plan Note (Signed)
C/w severe sprain vs fx, for pain control, ankle brace, and film tonight, consider sport med vs orthopedic referral

## 2016-02-22 NOTE — Patient Instructions (Signed)
Please take all new medication as prescribed - the pain medication  OK to go to [facility] for the xray, or return here tomorrow AM  Please continue all other medications as before, and refills have been done if requested.  Please have the pharmacy call with any other refills you may need.  Please keep your appointments with your specialists as you may have planned

## 2016-02-22 NOTE — Progress Notes (Signed)
Pre visit review using our clinic review tool, if applicable. No additional management support is needed unless otherwise documented below in the visit note. 

## 2016-04-16 ENCOUNTER — Encounter: Payer: Self-pay | Admitting: Internal Medicine

## 2016-04-16 ENCOUNTER — Ambulatory Visit (INDEPENDENT_AMBULATORY_CARE_PROVIDER_SITE_OTHER): Payer: BLUE CROSS/BLUE SHIELD | Admitting: Internal Medicine

## 2016-04-16 VITALS — BP 138/98 | HR 98 | Temp 98.6°F | Ht 65.5 in | Wt 179.0 lb

## 2016-04-16 DIAGNOSIS — J069 Acute upper respiratory infection, unspecified: Secondary | ICD-10-CM

## 2016-04-16 MED ORDER — IBUPROFEN 600 MG PO TABS: 600.0000 mg | ORAL_TABLET | Freq: Three times a day (TID) | ORAL | 0 refills | Status: DC | PRN

## 2016-04-16 MED ORDER — HYDROCODONE-HOMATROPINE 5-1.5 MG/5ML PO SYRP: 5.0000 mL | ORAL_SOLUTION | Freq: Four times a day (QID) | ORAL | 0 refills | Status: AC | PRN

## 2016-04-16 NOTE — Patient Instructions (Addendum)
Please take all new medication as prescribed - the cough medicine, and ibuprofen for pain  You can also take Mucinex (or it's generic off brand) for congestion, and tylenol as needed for pain.  Please continue all other medications as before, and refills have been done if requested.  Please have the pharmacy call with any other refills you may need.  Please keep your appointments with your specialists as you may have planned

## 2016-04-16 NOTE — Progress Notes (Signed)
   Subjective:    Patient ID: Lauren Mcknight, female    DOB: 04/30/1995, 21 y.o.   MRN: [id number]  HPI   Here with 2-3 days acute onset fever, facial pain, pressure, headache, general weakness and malaise, and clear d/c, with mild ST and cough, but pt denies chest pain, wheezing, increased sob or doe, orthopnea, PND, increased LE swelling, palpitations, dizziness or syncope.   Past Medical History:  Diagnosis Date  . ALLERGIC REACTION 07/20/2008   Qualifier: Diagnosis of  By: Shary DecampHawks, Stacia    . Allergy    Past Surgical History:  Procedure Laterality Date  . APPENDECTOMY    . BREAST LUMPECTOMY  2011    reports that she has never smoked. She has never used smokeless tobacco. She reports that she does not drink alcohol or use drugs. family history includes Diabetes in her mother. Allergies  Allergen Reactions  . Latex Anaphylaxis  . Naproxen Nausea Only   Current Outpatient Prescriptions on File Prior to Visit  Medication Sig Dispense Refill  . etonogestrel-ethinyl estradiol (NUVARING) 0.12-0.015 MG/24HR vaginal ring Place 1 each vaginally every 28 (twenty-eight) days. Insert vaginally and leave in place for 3 consecutive weeks, then remove for 1 week.    . valACYclovir (VALTREX) 1000 MG tablet Take 1 tablet (1,000 mg total) by mouth 2 (two) times daily. 20 tablet 0   No current facility-administered medications on file prior to visit.     Review of Systems All otherwise neg per pt     Objective:   Physical Exam BP (!) 138/98   Pulse 98   Temp 98.6 F (37 C)   Ht 5' 5.5" (1.664 m)   Wt 179 lb (81.2 kg)   SpO2 98%   BMI 29.33 kg/m  VS noted,  Constitutional: Pt appears in no apparent distress HENT: Head: NCAT.  Right Ear: External ear normal.  Left Ear: External ear normal.  Eyes: . Pupils are equal, round, and reactive to light. Conjunctivae and EOM are normal Bilat tm's with mild erythema.  Max sinus areas non tender.  Pharynx with mild erythema, no exudate Neck:  Normal range of motion. Neck supple.  Cardiovascular: Normal rate and regular rhythm.   Pulmonary/Chest: Effort normal and breath sounds without rales or wheezing.  Neurological: Pt is alert. Not confused , motor grossly intact Skin: Skin is warm. No rash, no LE edema Psychiatric: Pt behavior is normal. No agitation.  No other exam findings    Assessment & Plan:

## 2016-04-17 ENCOUNTER — Telehealth: Payer: Self-pay | Admitting: Internal Medicine

## 2016-04-17 MED ORDER — AZITHROMYCIN 250 MG PO TABS: ORAL_TABLET | ORAL | 1 refills | Status: DC

## 2016-04-17 NOTE — Telephone Encounter (Signed)
Notified pt rx sent to [facility] 

## 2016-04-17 NOTE — Telephone Encounter (Signed)
Done erx 

## 2016-04-17 NOTE — Telephone Encounter (Signed)
Patient was here yesterday. She called in and said she is feeling more congestion this morning. She would now like the abx called in to her pharmacy which is [facility]. Could you please follow up with patient. Thank you.

## 2016-04-20 NOTE — Assessment & Plan Note (Signed)
Mild to mod, with cough, c/w likely viral illness, for cough med, ibuprofen prn,  to f/u any worsening symptoms or concerns

## 2017-04-18 ENCOUNTER — Other Ambulatory Visit: Payer: Self-pay | Admitting: Internal Medicine

## 2019-11-01 ENCOUNTER — Telehealth: Payer: Self-pay | Admitting: Internal Medicine

## 2019-11-01 NOTE — Telephone Encounter (Signed)
    Patient last seen 2018, are you okay with re-establishing with her? Please advise

## 2019-11-01 NOTE — Telephone Encounter (Signed)
Ok with me 

## 2019-11-16 ENCOUNTER — Ambulatory Visit: Payer: BLUE CROSS/BLUE SHIELD | Admitting: Internal Medicine

## 2019-12-01 DIAGNOSIS — R87619 Unspecified abnormal cytological findings in specimens from cervix uteri: Secondary | ICD-10-CM | POA: Insufficient documentation

## 2019-12-13 ENCOUNTER — Telehealth: Payer: Self-pay | Admitting: Internal Medicine

## 2019-12-13 ENCOUNTER — Encounter: Payer: Self-pay | Admitting: Internal Medicine

## 2019-12-13 ENCOUNTER — Other Ambulatory Visit: Payer: Self-pay

## 2019-12-13 ENCOUNTER — Ambulatory Visit (INDEPENDENT_AMBULATORY_CARE_PROVIDER_SITE_OTHER): Payer: BC Managed Care – PPO | Admitting: Internal Medicine

## 2019-12-13 DIAGNOSIS — J309 Allergic rhinitis, unspecified: Secondary | ICD-10-CM | POA: Insufficient documentation

## 2019-12-13 DIAGNOSIS — J069 Acute upper respiratory infection, unspecified: Secondary | ICD-10-CM | POA: Insufficient documentation

## 2019-12-13 DIAGNOSIS — Z789 Other specified health status: Secondary | ICD-10-CM | POA: Diagnosis not present

## 2019-12-13 DIAGNOSIS — T7840XA Allergy, unspecified, initial encounter: Secondary | ICD-10-CM | POA: Insufficient documentation

## 2019-12-13 DIAGNOSIS — T7840XD Allergy, unspecified, subsequent encounter: Secondary | ICD-10-CM | POA: Diagnosis not present

## 2019-12-13 MED ORDER — HYDROCODONE-HOMATROPINE 5-1.5 MG/5ML PO SYRP: 5.0000 mL | ORAL_SOLUTION | Freq: Four times a day (QID) | ORAL | 0 refills | Status: AC | PRN

## 2019-12-13 MED ORDER — AZITHROMYCIN 250 MG PO TABS: ORAL_TABLET | ORAL | 1 refills | Status: DC

## 2019-12-13 NOTE — Assessment & Plan Note (Signed)
Ok to sign form when presented

## 2019-12-13 NOTE — Assessment & Plan Note (Addendum)
Mild to mod, for antibx course,  to f/u any worsening symptoms or concerns  I spent 31 minutes in preparing to see the patient by review of recent labs, imaging and procedures, obtaining and reviewing separately obtained history, communicating with the patient and family or caregiver, ordering medications, tests or procedures, and documenting clinical information in the EHR including the differential Dx, treatment, and any further evaluation and other management of URI, allergies and smoking status

## 2019-12-13 NOTE — Assessment & Plan Note (Signed)
Ok for otc antihistamine and nasal steroid

## 2019-12-13 NOTE — Patient Instructions (Signed)
Please take all new medication as prescribed - the antibiotic, and cough medicine  As needed  Please continue all other medications as before, and refills have been done if requested.  Please have the pharmacy call with any other refills you may need.  Please continue your efforts at being more active, low cholesterol diet, and weight control.  Please keep your appointments with your specialists as you may have planned

## 2019-12-13 NOTE — Progress Notes (Signed)
   Subjective:    Patient ID: Lauren Mcknight, female    DOB: 07/22/1995, 24 y.o.   MRN: [id number]  HPI  Her to f/u as new pt, last seen mar 2018  Here with 2-3 days acute onset fever, facial pain, pressure, headache, general weakness and malaise, and greenish d/c, with mild ST and cough, but pt denies chest pain, wheezing, increased sob or doe, orthopnea, PND, increased LE swelling, palpitations, dizziness or syncope.  Does have several wks ongoing nasal allergy symptoms with clearish congestion, itch and sneezing, without fever, pain, ST, cough, swelling or wheezing.  Pt remains non smoker, needs attestation signed for work but did not bring the form today  Last tested neg covid neg oct 18 Past Medical History:  Diagnosis Date  . ALLERGIC REACTION 07/20/2008   Qualifier: Diagnosis of  By: Shary Decamp    . Allergy    Past Surgical History:  Procedure Laterality Date  . APPENDECTOMY    . BREAST LUMPECTOMY  2011    reports that she has never smoked. She has never used smokeless tobacco. She reports that she does not drink alcohol and does not use drugs. family history includes Diabetes in her mother. Allergies  Allergen Reactions  . Latex Anaphylaxis  . Naproxen Nausea Only   Current Outpatient Medications on File Prior to Visit  Medication Sig Dispense Refill  . etonogestrel-ethinyl estradiol (NUVARING) 0.12-0.015 MG/24HR vaginal ring Place 1 each vaginally every 28 (twenty-eight) days. Insert vaginally and leave in place for 3 consecutive weeks, then remove for 1 week.    Marland Kitchen ibuprofen (ADVIL,MOTRIN) 600 MG tablet Take 1 tablet (600 mg total) by mouth every 8 (eight) hours as needed. 40 tablet 0  . valACYclovir (VALTREX) 1000 MG tablet Take 1 tablet (1,000 mg total) by mouth 2 (two) times daily. 20 tablet 0   No current facility-administered medications on file prior to visit.   Review of Systems All otherwise neg per pt    Objective:   Physical Exam BP 118/80 (BP Location: Left Arm,  Patient Position: Sitting, Cuff Size: Large)   Pulse (!) 104   Temp 98.4 F (36.9 C) (Oral)   Ht 5' 5.5" (1.664 m)   Wt 214 lb (97.1 kg)   SpO2 98%   BMI 35.07 kg/m  VS noted,  Constitutional: Pt appears in NAD HENT: Head: NCAT.  Right Ear: External ear normal.  Left Ear: External ear normal.  Eyes: . Pupils are equal, round, and reactive to light. Conjunctivae and EOM are normal Bilat tm's with mild erythema.  Max sinus areas mild tender.  Pharynx with mild erythema, no exudate Nose: without d/c or deformity Neck: Neck supple. Gross normal ROM Cardiovascular: Normal rate and regular rhythm.   Pulmonary/Chest: Effort normal and breath sounds without rales or wheezing.  Abd:  Soft, NT, ND, + BS, no organomegaly Neurological: Pt is alert. At baseline orientation, motor grossly intact Skin: Skin is warm. No rashes, other new lesions, no LE edema Psychiatric: Pt behavior is normal without agitation  All otherwise neg per pt Lab Results  Component Value Date   WBC 6.2 08/05/2008   HGB 13.1 08/05/2008   HCT 40.0 08/05/2008   PLT 283 08/05/2008      Assessment & Plan:

## 2019-12-13 NOTE — Telephone Encounter (Signed)
Patient called an was wondering if something for a yeast infection could be called in, she said after she takes antibiotics she gets one. It can be sent to [id number] - [place], [place] - [address]   Please call the patient if rx is sent [phone]

## 2019-12-14 MED ORDER — FLUCONAZOLE 150 MG PO TABS: ORAL_TABLET | ORAL | 1 refills | Status: DC

## 2019-12-14 NOTE — Telephone Encounter (Signed)
Done erx 

## 2020-01-20 ENCOUNTER — Ambulatory Visit (INDEPENDENT_AMBULATORY_CARE_PROVIDER_SITE_OTHER): Payer: BC Managed Care – PPO | Admitting: Internal Medicine

## 2020-01-20 ENCOUNTER — Other Ambulatory Visit: Payer: Self-pay

## 2020-01-20 ENCOUNTER — Ambulatory Visit (INDEPENDENT_AMBULATORY_CARE_PROVIDER_SITE_OTHER): Payer: BC Managed Care – PPO

## 2020-01-20 ENCOUNTER — Encounter: Payer: Self-pay | Admitting: Internal Medicine

## 2020-01-20 ENCOUNTER — Telehealth: Payer: Self-pay | Admitting: Internal Medicine

## 2020-01-20 DIAGNOSIS — F419 Anxiety disorder, unspecified: Secondary | ICD-10-CM

## 2020-01-20 DIAGNOSIS — R1013 Epigastric pain: Secondary | ICD-10-CM | POA: Diagnosis not present

## 2020-01-20 DIAGNOSIS — M545 Low back pain, unspecified: Secondary | ICD-10-CM

## 2020-01-20 DIAGNOSIS — M542 Cervicalgia: Secondary | ICD-10-CM | POA: Diagnosis not present

## 2020-01-20 LAB — HEPATIC FUNCTION PANEL
ALT: 11 U/L (ref 0–35)
AST: 14 U/L (ref 0–37)
Albumin: 3.9 g/dL (ref 3.5–5.2)
Alkaline Phosphatase: 67 U/L (ref 39–117)
Bilirubin, Direct: 0 mg/dL (ref 0.0–0.3)
Total Bilirubin: 0.3 mg/dL (ref 0.2–1.2)
Total Protein: 7.7 g/dL (ref 6.0–8.3)

## 2020-01-20 LAB — URINALYSIS, ROUTINE W REFLEX MICROSCOPIC
Bilirubin Urine: NEGATIVE
Nitrite: NEGATIVE
RBC / HPF: NONE SEEN (ref 0–?)
Specific Gravity, Urine: 1.025 (ref 1.000–1.030)
Total Protein, Urine: NEGATIVE
Urine Glucose: NEGATIVE
Urobilinogen, UA: 0.2 (ref 0.0–1.0)
pH: 6 (ref 5.0–8.0)

## 2020-01-20 LAB — CBC WITH DIFFERENTIAL/PLATELET
Basophils Absolute: 0 10*3/uL (ref 0.0–0.1)
Basophils Relative: 0.2 % (ref 0.0–3.0)
Eosinophils Absolute: 0.2 10*3/uL (ref 0.0–0.7)
Eosinophils Relative: 1.6 % (ref 0.0–5.0)
HCT: 43.8 % (ref 36.0–46.0)
Hemoglobin: 14.2 g/dL (ref 12.0–15.0)
Lymphocytes Relative: 27.6 % (ref 12.0–46.0)
Lymphs Abs: 2.7 10*3/uL (ref 0.7–4.0)
MCHC: 32.4 g/dL (ref 30.0–36.0)
MCV: 89.9 fl (ref 78.0–100.0)
Monocytes Absolute: 0.5 10*3/uL (ref 0.1–1.0)
Monocytes Relative: 5.1 % (ref 3.0–12.0)
Neutro Abs: 6.5 10*3/uL (ref 1.4–7.7)
Neutrophils Relative %: 65.5 % (ref 43.0–77.0)
Platelets: 320 10*3/uL (ref 150.0–400.0)
RBC: 4.87 Mil/uL (ref 3.87–5.11)
RDW: 13.5 % (ref 11.5–15.5)
WBC: 9.9 10*3/uL (ref 4.0–10.5)

## 2020-01-20 LAB — LIPID PANEL
Cholesterol: 146 mg/dL (ref 0–200)
HDL: 54.2 mg/dL (ref >39.00)
LDL Cholesterol: 66 mg/dL (ref 0–99)
NonHDL: 91.73
Total CHOL/HDL Ratio: 3
Triglycerides: 128 mg/dL (ref 0.0–149.0)
VLDL: 25.6 mg/dL (ref 0.0–40.0)

## 2020-01-20 MED ORDER — TRAMADOL HCL 50 MG PO TABS
50.0000 mg | ORAL_TABLET | Freq: Four times a day (QID) | ORAL | 0 refills | Status: DC | PRN
Start: 2020-01-20 — End: 2020-10-24

## 2020-01-20 MED ORDER — TRAMADOL HCL 50 MG PO TABS: 50.0000 mg | ORAL_TABLET | Freq: Four times a day (QID) | ORAL | 0 refills | Status: DC | PRN

## 2020-01-20 MED ORDER — CYCLOBENZAPRINE HCL 5 MG PO TABS: 5.0000 mg | ORAL_TABLET | Freq: Three times a day (TID) | ORAL | 0 refills | Status: DC | PRN

## 2020-01-20 NOTE — Telephone Encounter (Signed)
Sent to Dr. John. 

## 2020-01-20 NOTE — Telephone Encounter (Signed)
Bunnie called, wants to know if you wanted to write her out of work, one more week before they go on break.  Please call the patient and advise at [phone]

## 2020-01-20 NOTE — Telephone Encounter (Signed)
Patient called and said that she changed pharmacy's and cyclobenzaprine (FLEXERIL) 5 MG tablet  And traMADol (ULTRAM) 50 MG tablet [facility]  [address] [place], [place] [address]

## 2020-01-20 NOTE — Patient Instructions (Addendum)
Please take all new medication as prescribed - the pain medication, and muscle relaxer as needed  Please continue all other medications as before, and refills have been done if requested.  Please have the pharmacy call with any other refills you may need.  Please continue your efforts at being more active, low cholesterol diet, and weight control.  Please keep your appointments with your specialists as you may have planned  Please go to the XRAY Department in the first floor for the x-ray testing  Please go to the LAB at the blood drawing area for the tests to be done  You will be contacted by phone if any changes need to be made immediately.  Otherwise, you will receive a letter about your results with an explanation, but please check with MyChart first.  Please remember to sign up for MyChart if you have not done so, as this will be important to you in the future with finding out test results, communicating by private email, and scheduling acute appointments online when needed.

## 2020-01-20 NOTE — Telephone Encounter (Signed)
Done erx 

## 2020-01-21 ENCOUNTER — Encounter: Payer: Self-pay | Admitting: Internal Medicine

## 2020-01-22 ENCOUNTER — Encounter: Payer: Self-pay | Admitting: Internal Medicine

## 2020-01-22 DIAGNOSIS — R1013 Epigastric pain: Secondary | ICD-10-CM | POA: Insufficient documentation

## 2020-01-22 DIAGNOSIS — F419 Anxiety disorder, unspecified: Secondary | ICD-10-CM | POA: Insufficient documentation

## 2020-01-22 NOTE — Assessment & Plan Note (Signed)
Reassured,  to f/u any worsening symptoms or concerns 
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
## 2020-01-22 NOTE — Assessment & Plan Note (Addendum)
Exam with minimal findings, for tx as above, for labs as ordered,  to f/u any worsening symptoms or concerns

## 2020-01-22 NOTE — Assessment & Plan Note (Signed)
Exam benign, for tx as above,  to f/u any worsening symptoms or concerns

## 2020-01-25 ENCOUNTER — Telehealth: Payer: Self-pay | Admitting: Internal Medicine

## 2020-01-25 NOTE — Telephone Encounter (Signed)
Sent to Dr. John. 

## 2020-01-25 NOTE — Telephone Encounter (Signed)
Ok for dec 27 - should be ok to return to work by then
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
## 2020-01-26 ENCOUNTER — Encounter: Payer: Self-pay | Admitting: Internal Medicine

## 2020-01-26 ENCOUNTER — Telehealth: Payer: Self-pay | Admitting: Internal Medicine

## 2020-01-26 NOTE — Telephone Encounter (Signed)
Patient is complaining of pain in between her shoulder blades that started today. The pain is radiating up to the base of her head and causing a headache in the front of her head.   Please advise and call back at [phone]

## 2020-01-26 NOTE — Telephone Encounter (Signed)
Spoke with Lauren Mcknight and was able to inform her that Dr. Jonny Ruiz has agreed to write her note but only to be out until 02/07/2020. Lauren Mcknight understood and states she will be by at some point this week to pick up her note.
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
## 2020-01-27 NOTE — Telephone Encounter (Signed)
Sent to Dr. John. °

## 2020-01-28 NOTE — Telephone Encounter (Signed)
LVM for pt.

## 2020-02-08 ENCOUNTER — Telehealth: Payer: Self-pay | Admitting: Internal Medicine

## 2020-02-08 NOTE — Telephone Encounter (Signed)
Fax has been sent

## 2020-02-08 NOTE — Telephone Encounter (Signed)
Patient requesting we send her x-rays to [facility]. Phone# [phone] Fax# [phone]

## 2020-06-12 ENCOUNTER — Ambulatory Visit (INDEPENDENT_AMBULATORY_CARE_PROVIDER_SITE_OTHER): Payer: BC Managed Care – PPO | Admitting: Internal Medicine

## 2020-06-12 ENCOUNTER — Other Ambulatory Visit: Payer: Self-pay

## 2020-06-12 VITALS — BP 118/80 | HR 74 | Temp 98.2°F | Ht 65.0 in | Wt 208.0 lb

## 2020-06-12 DIAGNOSIS — J329 Chronic sinusitis, unspecified: Secondary | ICD-10-CM | POA: Insufficient documentation

## 2020-06-12 DIAGNOSIS — F419 Anxiety disorder, unspecified: Secondary | ICD-10-CM | POA: Diagnosis not present

## 2020-06-12 DIAGNOSIS — T7840XD Allergy, unspecified, subsequent encounter: Secondary | ICD-10-CM | POA: Diagnosis not present

## 2020-06-12 MED ORDER — AZITHROMYCIN 250 MG PO TABS: ORAL_TABLET | ORAL | 1 refills | Status: AC

## 2020-06-12 MED ORDER — PREDNISONE 10 MG PO TABS: ORAL_TABLET | ORAL | 0 refills | Status: DC

## 2020-06-12 MED ORDER — METHYLPREDNISOLONE ACETATE 80 MG/ML IJ SUSP
80.0000 mg | Freq: Once | INTRAMUSCULAR | Status: AC
  Administered 2020-06-12: 80 mg via INTRAMUSCULAR

## 2020-06-12 NOTE — Patient Instructions (Signed)
You had the steroid shot today  Please take all new medication as prescribed - the antibiotic, and prednisone  Please continue all other medications as before, and refills have been done if requested.  Please have the pharmacy call with any other refills you may need.  Please keep your appointments with your specialists as you may have planned    

## 2020-06-12 NOTE — Progress Notes (Signed)
Patient ID: Lauren Mcknight, female   DOB: 02-16-1995, 25 y.o.   MRN: [id number]        Chief Complaint: sinus and allergy symptoms       HPI:  Lauren Mcknight is a 25 y.o. female  Here with 2-3 days acute onset fever, facial pain, pressure, headache, general weakness and malaise, and greenish d/c, with mild ST and cough, but pt denies chest pain, wheezing, increased sob or doe, orthopnea, PND, increased LE swelling, palpitations, dizziness or syncope.  Also, Does have several wks ongoing nasal allergy symptoms with clearish congestion, itch and sneezing, without fever, pain, ST, cough, swelling or wheezing.  Currently employed as Emergency planning/management officer.  Sister also ill apr 10 at easter dinner.  Claritin and mucinex DM not working.  Denies worsening depressive symptoms, suicidal ideation, or panic; has ongoing anxiety, stable recently       Wt Readings from Last 3 Encounters:  06/12/20 208 lb (94.3 kg)  01/20/20 210 lb (95.3 kg)  12/13/19 214 lb (97.1 kg)   BP Readings from Last 3 Encounters:  06/12/20 118/80  01/20/20 110/78  12/13/19 118/80         Past Medical History:  Diagnosis Date  . ALLERGIC REACTION 07/20/2008   Qualifier: Diagnosis of  By: Shary Decamp    . Allergy    Past Surgical History:  Procedure Laterality Date  . APPENDECTOMY    . BREAST LUMPECTOMY  2011    reports that she has never smoked. She has never used smokeless tobacco. She reports that she does not drink alcohol and does not use drugs. family history includes Diabetes in her mother. Allergies  Allergen Reactions  . Latex Anaphylaxis  . Naproxen Nausea Only   Current Outpatient Medications on File Prior to Visit  Medication Sig Dispense Refill  . cyclobenzaprine (FLEXERIL) 5 MG tablet Take 1 tablet (5 mg total) by mouth 3 (three) times daily as needed for muscle spasms. 30 tablet 0  . etonogestrel-ethinyl estradiol (NUVARING) 0.12-0.015 MG/24HR vaginal ring Place 1 each vaginally every 28 (twenty-eight) days. Insert  vaginally and leave in place for 3 consecutive weeks, then remove for 1 week.    Marland Kitchen ibuprofen (ADVIL,MOTRIN) 600 MG tablet Take 1 tablet (600 mg total) by mouth every 8 (eight) hours as needed. 40 tablet 0  . traMADol (ULTRAM) 50 MG tablet Take 1 tablet (50 mg total) by mouth every 6 (six) hours as needed. 30 tablet 0  . valACYclovir (VALTREX) 1000 MG tablet Take 1 tablet (1,000 mg total) by mouth 2 (two) times daily. 20 tablet 0   No current facility-administered medications on file prior to visit.        ROS:  All others reviewed and negative.  Objective        PE:  BP 118/80 (BP Location: Left Arm, Patient Position: Sitting, Cuff Size: Normal)   Pulse 74   Temp 98.2 F (36.8 C) (Oral)   Ht 5\' 5"  (1.651 m)   Wt 208 lb (94.3 kg)   LMP 06/07/2020   SpO2 99%   BMI 34.61 kg/m                 Constitutional: Pt appears in NAD               HENT: Head: NCAT.                Right Ear: External ear normal.  Left Ear: External ear normal.                Eyes: . Pupils are equal, round, and reactive to light. Conjunctivae and EOM are normal; Bilat tm's with mild erythema.  Max sinus areas mild tender.  Pharynx with mild erythema, no exudate               Nose: without d/c or deformity               Neck: Neck supple. Gross normal ROM               Cardiovascular: Normal rate and regular rhythm.                 Pulmonary/Chest: Effort normal and breath sounds without rales or wheezing.                              Neurological: Pt is alert. At baseline orientation, motor grossly intact               Skin: Skin is warm. No rashes, no other new lesions, LE edema - none               Psychiatric: Pt behavior is normal without agitation   Micro: none  Cardiac tracings I have personally interpreted today:  none  Pertinent Radiological findings (summarize): none   Lab Results  Component Value Date   WBC 9.9 01/20/2020   HGB 14.2 01/20/2020   HCT 43.8 01/20/2020   PLT  320.0 01/20/2020   CHOL 146 01/20/2020   TRIG 128.0 01/20/2020   HDL 54.20 01/20/2020   LDLCALC 66 01/20/2020   ALT 11 01/20/2020   AST 14 01/20/2020   Assessment/Plan:  Lauren Mcknight is a 25 y.o. Black or African American [2] female with  has a past medical history of ALLERGIC REACTION (07/20/2008) and Allergy.  Sinusitis Mild to mod, for antibx course,  to f/u any worsening symptoms or concerns  Allergies Mild to mod, for depomedrol im 80, predpac asd,,  to f/u any worsening symptoms or concerns  Anxiety Stable overall, declines need for any med change at this time  Followup: Return if symptoms worsen or fail to improve.  Oliver Barre, MD 06/18/2020 6:14 PM [facility]  Primary Care - [facility]

## 2020-06-18 ENCOUNTER — Encounter: Payer: Self-pay | Admitting: Internal Medicine

## 2020-06-18 NOTE — Assessment & Plan Note (Signed)
Mild to mod, for depomedrol im 80, predpac asd,,  to f/u any worsening symptoms or concerns 

## 2020-06-18 NOTE — Assessment & Plan Note (Signed)
Mild to mod, for antibx course,  to f/u any worsening symptoms or concerns 

## 2020-06-18 NOTE — Assessment & Plan Note (Signed)
Stable overall, declines need for any med change at this time

## 2020-07-26 ENCOUNTER — Telehealth: Payer: BC Managed Care – PPO | Admitting: Family Medicine

## 2020-07-27 ENCOUNTER — Telehealth: Payer: BC Managed Care – PPO | Admitting: Family Medicine

## 2020-07-31 ENCOUNTER — Telehealth (INDEPENDENT_AMBULATORY_CARE_PROVIDER_SITE_OTHER): Payer: BC Managed Care – PPO | Admitting: Internal Medicine

## 2020-07-31 ENCOUNTER — Encounter: Payer: Self-pay | Admitting: Internal Medicine

## 2020-07-31 DIAGNOSIS — T7840XD Allergy, unspecified, subsequent encounter: Secondary | ICD-10-CM

## 2020-07-31 DIAGNOSIS — F419 Anxiety disorder, unspecified: Secondary | ICD-10-CM | POA: Diagnosis not present

## 2020-07-31 DIAGNOSIS — J069 Acute upper respiratory infection, unspecified: Secondary | ICD-10-CM

## 2020-07-31 MED ORDER — HYDROCOD POLST-CPM POLST ER 10-8 MG/5ML PO SUER: 5.0000 mL | Freq: Two times a day (BID) | ORAL | 0 refills | Status: DC | PRN

## 2020-07-31 MED ORDER — OSELTAMIVIR PHOSPHATE 75 MG PO CAPS
75.0000 mg | ORAL_CAPSULE | Freq: Two times a day (BID) | ORAL | 0 refills | Status: DC
Start: 2020-07-31 — End: 2020-10-24

## 2020-07-31 NOTE — Assessment & Plan Note (Signed)
C/w influenza, for tamiflu, cough med prn,  to f/u any worsening symptoms or concerns

## 2020-07-31 NOTE — Patient Instructions (Signed)
Please take all new medication as prescribed 

## 2020-07-31 NOTE — Progress Notes (Deleted)
Patient ID: Lauren Mcknight, female   DOB: 19-Jul-1995, 25 y.o.   MRN: [id number]        Chief Complaint: follow up alergies, anxiety and URI        HPI:  Lauren Mcknight is a 25 y.o. female here with flu like symptoms with fever, HA, fatigue, cough, congestion, ST and feverish, after several in family with same in the past wk now diagnosed with influenza.  Pt is covid neg x 2.  Does have several wks ongoing nasal allergy symptoms with clearish congestion, itch and sneezing, without fever, pain, ST, cough, swelling or wheezing.  Denies worsening depressive symptoms, suicidal ideation, or panic;       Wt Readings from Last 3 Encounters:  06/12/20 208 lb (94.3 kg)  01/20/20 210 lb (95.3 kg)  12/13/19 214 lb (97.1 kg)   BP Readings from Last 3 Encounters:  06/12/20 118/80  01/20/20 110/78  12/13/19 118/80         Past Medical History:  Diagnosis Date   ALLERGIC REACTION 07/20/2008   Qualifier: Diagnosis of  By: Shary Decamp     Allergy    Past Surgical History:  Procedure Laterality Date   APPENDECTOMY     BREAST LUMPECTOMY  2011    reports that she has never smoked. She has never used smokeless tobacco. She reports that she does not drink alcohol and does not use drugs. family history includes Diabetes in her mother. Allergies  Allergen Reactions   Latex Anaphylaxis   Naproxen Nausea Only   Current Outpatient Medications on File Prior to Visit  Medication Sig Dispense Refill   cyclobenzaprine (FLEXERIL) 5 MG tablet Take 1 tablet (5 mg total) by mouth 3 (three) times daily as needed for muscle spasms. 30 tablet 0   etonogestrel-ethinyl estradiol (NUVARING) 0.12-0.015 MG/24HR vaginal ring Place 1 each vaginally every 28 (twenty-eight) days. Insert vaginally and leave in place for 3 consecutive weeks, then remove for 1 week.     ibuprofen (ADVIL,MOTRIN) 600 MG tablet Take 1 tablet (600 mg total) by mouth every 8 (eight) hours as needed. 40 tablet 0   predniSONE (DELTASONE) 10 MG tablet 3  tabs by mouth per day for 3 days,2tabs per day for 3 days,1tab per day for 3 days 18 tablet 0   traMADol (ULTRAM) 50 MG tablet Take 1 tablet (50 mg total) by mouth every 6 (six) hours as needed. 30 tablet 0   valACYclovir (VALTREX) 1000 MG tablet Take 1 tablet (1,000 mg total) by mouth 2 (two) times daily. 20 tablet 0   No current facility-administered medications on file prior to visit.        ROS:  All others reviewed and negative.  Objective        PE:  There were no vitals taken for this visit.                Constitutional: Pt appears in NAD               HENT: Head: NCAT.                Right Ear: External ear normal.                 Left Ear: External ear normal.                Eyes: . Pupils are equal, round, and reactive to light. Conjunctivae and EOM are normal  Nose: without d/c or deformity               Neck: Neck supple. Gross normal ROM               Cardiovascular: Normal rate and regular rhythm.                 Pulmonary/Chest: Effort normal and breath sounds without rales or wheezing.                Abd:  Soft, NT, ND, + BS, no organomegaly               Neurological: Pt is alert. At baseline orientation, motor grossly intact               Skin: Skin is warm. No rashes, no other new lesions, LE edema - none               Psychiatric: Pt behavior is normal without agitation   Micro: none  Cardiac tracings I have personally interpreted today:  none  Pertinent Radiological findings (summarize): none   Lab Results  Component Value Date   WBC 9.9 01/20/2020   HGB 14.2 01/20/2020   HCT 43.8 01/20/2020   PLT 320.0 01/20/2020   CHOL 146 01/20/2020   TRIG 128.0 01/20/2020   HDL 54.20 01/20/2020   LDLCALC 66 01/20/2020   ALT 11 01/20/2020   AST 14 01/20/2020   Assessment/Plan:  Lauren Mcknight is a 25 y.o. Black or African American [2] female with  has a past medical history of ALLERGIC REACTION (07/20/2008) and Allergy.  URI (upper respiratory  infection) C/w influenza, for tamiflu, cough med prn,  to f/u any worsening symptoms or concerns  Anxiety Stable, pt declines need for change in tx at this time or counseling referral  Allergies Mild seasonal flare, for otc allegra prn,  to f/u any worsening symptoms or concerns  Followup: Return if symptoms worsen or fail to improve.  Oliver Barre, MD 07/31/2020 10:42 PM [facility] [place] [facility] - [facility]

## 2020-07-31 NOTE — Assessment & Plan Note (Signed)
Mild seasonal flare, for otc allegra prn,  to f/u any worsening symptoms or concerns

## 2020-07-31 NOTE — Assessment & Plan Note (Signed)
Stable, pt declines need for change in tx at this time or counseling referral

## 2020-08-06 ENCOUNTER — Encounter: Payer: Self-pay | Admitting: Internal Medicine

## 2020-08-06 NOTE — Progress Notes (Addendum)
Patient ID: Lauren Mcknight, female   DOB: 09-Jun-1995, 25 y.o.   MRN: [id number]  Virtual Visit via Video Note  I connected with Lauren Mcknight on juen 20, 2022 at  2:40 PM EDT by a video enabled telemedicine application and verified that I am speaking with the correct person using two identifiers.  Location of all participants today Patient: at home Provider: at office   I discussed the limitations of evaluation and management by telemedicine and the availability of in person appointments. The patient expressed understanding and agreed to proceed.  History of Present Illness: ere with flu like symptoms with fever, HA, fatigue, cough, congestion, ST and feverish, after several in family with same in the past wk now diagnosed with influenza.  Pt is covid neg x 2.  Does have several wks ongoing nasal allergy symptoms with clearish congestion, itch and sneezing, without fever, pain, ST, cough, swelling or wheezing.  Denies worsening depressive symptoms, suicidal ideation, or panic; Past Medical History:  Diagnosis Date   ALLERGIC REACTION 07/20/2008   Qualifier: Diagnosis of  By: Shary Decamp     Allergy    Past Surgical History:  Procedure Laterality Date   APPENDECTOMY     BREAST LUMPECTOMY  2011    reports that she has never smoked. She has never used smokeless tobacco. She reports that she does not drink alcohol and does not use drugs. family history includes Diabetes in her mother. Allergies  Allergen Reactions   Latex Anaphylaxis   Naproxen Nausea Only   Current Outpatient Medications on File Prior to Visit  Medication Sig Dispense Refill   cyclobenzaprine (FLEXERIL) 5 MG tablet Take 1 tablet (5 mg total) by mouth 3 (three) times daily as needed for muscle spasms. 30 tablet 0   etonogestrel-ethinyl estradiol (NUVARING) 0.12-0.015 MG/24HR vaginal ring Place 1 each vaginally every 28 (twenty-eight) days. Insert vaginally and leave in place for 3 consecutive weeks, then remove for 1  week.     ibuprofen (ADVIL,MOTRIN) 600 MG tablet Take 1 tablet (600 mg total) by mouth every 8 (eight) hours as needed. 40 tablet 0   predniSONE (DELTASONE) 10 MG tablet 3 tabs by mouth per day for 3 days,2tabs per day for 3 days,1tab per day for 3 days 18 tablet 0   traMADol (ULTRAM) 50 MG tablet Take 1 tablet (50 mg total) by mouth every 6 (six) hours as needed. 30 tablet 0   valACYclovir (VALTREX) 1000 MG tablet Take 1 tablet (1,000 mg total) by mouth 2 (two) times daily. 20 tablet 0   No current facility-administered medications on file prior to visit.    Observations/Objective: Alert, NAD, appropriate mood and affect, resps normal, cn 2-12 intact, moves all 4s, no visible rash or swelling Lab Results  Component Value Date   WBC 9.9 01/20/2020   HGB 14.2 01/20/2020   HCT 43.8 01/20/2020   PLT 320.0 01/20/2020   CHOL 146 01/20/2020   TRIG 128.0 01/20/2020   HDL 54.20 01/20/2020   LDLCALC 66 01/20/2020   ALT 11 01/20/2020   AST 14 01/20/2020   Assessment/plan:  URI (upper respiratory infection) C/w influenza, for tamiflu, cough med prn,  to f/u any worsening symptoms or concerns  Anxiety Stable, pt declines need for change in tx at this time or counseling referral  Allergies Mild seasonal flare, for otc allegra prn,  to f/u any worsening symptoms or concerns   Follow Up Instructions:  Followup: Return if symptoms worsen or fail to improve.  I discussed the assessment and treatment plan with the patient. The patient was provided an opportunity to ask questions and all were answered. The patient agreed with the plan and demonstrated an understanding of the instructions.   The patient was advised to call back or seek an in-person evaluation if the symptoms worsen or if the condition fails to improve as anticipated.   Oliver Barre, MD

## 2020-10-24 ENCOUNTER — Telehealth (INDEPENDENT_AMBULATORY_CARE_PROVIDER_SITE_OTHER): Payer: BC Managed Care – PPO | Admitting: Internal Medicine

## 2020-10-24 DIAGNOSIS — F419 Anxiety disorder, unspecified: Secondary | ICD-10-CM

## 2020-10-24 DIAGNOSIS — J069 Acute upper respiratory infection, unspecified: Secondary | ICD-10-CM

## 2020-10-24 DIAGNOSIS — T7840XD Allergy, unspecified, subsequent encounter: Secondary | ICD-10-CM | POA: Diagnosis not present

## 2020-10-24 MED ORDER — HYDROCOD POLST-CPM POLST ER 10-8 MG/5ML PO SUER: 5.0000 mL | Freq: Two times a day (BID) | ORAL | 0 refills | Status: DC | PRN

## 2020-10-24 MED ORDER — DOXYCYCLINE HYCLATE 100 MG PO TABS: 100.0000 mg | ORAL_TABLET | Freq: Two times a day (BID) | ORAL | 0 refills | Status: DC

## 2020-10-24 NOTE — Progress Notes (Signed)
Patient ID: Lauren Mcknight, female   DOB: 05/06/1995, 25 y.o.   MRN: [id number]  Virtual Visit via Video Note  I connected with Lauren Mcknight on 10/24/20 at  3:20 PM EDT by a video enabled telemedicine application and verified that I am speaking with the correct person using two identifiers.  Location of all participants today Patient: at home Provider: at office   I discussed the limitations of evaluation and management by telemedicine and the availability of in person appointments. The patient expressed understanding and agreed to proceed.  History of Present Illness:  Here with 2-3 days acute onset fever, facial pain, severe ST pressure, headache, general weakness and malaise, with and non prod cough, but pt denies chest pain, wheezing, increased sob or doe, orthopnea, PND, increased LE swelling, palpitations, dizziness or syncope.  Was exposed to relative child now on antibx for strep.  COVID neg home tested yesterday   Pt denies polydipsia, polyuria, or new focal neuro s/s.  Does have several wks ongoing nasal allergy symptoms with clearish congestion, itch and sneezing, without fever, pain, ST, cough, swelling or wheezing.  Denies worsening depressive symptoms, suicidal ideation, or panic Past Medical History:  Diagnosis Date   ALLERGIC REACTION 07/20/2008   Qualifier: Diagnosis of  By: Shary Decamp     Allergy    Past Surgical History:  Procedure Laterality Date   APPENDECTOMY     BREAST LUMPECTOMY  2011    reports that she has never smoked. She has never used smokeless tobacco. She reports that she does not drink alcohol and does not use drugs. family history includes Diabetes in her mother. Allergies  Allergen Reactions   Latex Anaphylaxis   Naproxen Nausea Only   Current Outpatient Medications on File Prior to Visit  Medication Sig Dispense Refill   etonogestrel-ethinyl estradiol (NUVARING) 0.12-0.015 MG/24HR vaginal ring Place 1 each vaginally every 28 (twenty-eight) days.  Insert vaginally and leave in place for 3 consecutive weeks, then remove for 1 week.     ibuprofen (ADVIL,MOTRIN) 600 MG tablet Take 1 tablet (600 mg total) by mouth every 8 (eight) hours as needed. 40 tablet 0   No current facility-administered medications on file prior to visit.    Observations/Objective: Alert, NAD, appropriate mood and affect, resps normal, cn 2-12 intact, moves all 4s, no visible rash or swelling Lab Results  Component Value Date   WBC 9.9 01/20/2020   HGB 14.2 01/20/2020   HCT 43.8 01/20/2020   PLT 320.0 01/20/2020   CHOL 146 01/20/2020   TRIG 128.0 01/20/2020   HDL 54.20 01/20/2020   LDLCALC 66 01/20/2020   ALT 11 01/20/2020   AST 14 01/20/2020   Assessment and Plan: See notes  Follow Up Instructions: See notes   I discussed the assessment and treatment plan with the patient. The patient was provided an opportunity to ask questions and all were answered. The patient agreed with the plan and demonstrated an understanding of the instructions.   The patient was advised to call back or seek an in-person evaluation if the symptoms worsen or if the condition fails to improve as anticipated.  Oliver Barre, MD

## 2020-10-24 NOTE — Assessment & Plan Note (Signed)
Mild situational, reassurance, no other tx needed at this time

## 2020-10-24 NOTE — Patient Instructions (Signed)
Please take all new medication as prescribed 

## 2020-10-24 NOTE — Assessment & Plan Note (Signed)
Mild to mod, for antibx course,  to f/u any worsening symptoms or concerns 

## 2020-10-24 NOTE — Assessment & Plan Note (Signed)
Ok also for OTC nasacort asd, to f/u any worsening symptoms or concerns

## 2021-05-11 NOTE — Progress Notes (Signed)
? ?  Lauren Mcknight March 25, 1995 [id number] ? ? ?History:  26 y.o. G0 presents for annual exam. Has noticed a fishy vaginal odor for a few weeks, no itching or burning. No new partners. Doing well using nuva ring.  ? ?Gynecologic History ?Patient's last menstrual period was 04/21/2021 (exact date). ?Period Cycle (Days): 28 ?Period Duration (Days): 5 ?Period Pattern: Regular ?Menstrual Flow: Moderate ?Dysmenorrhea: (!) Mild ?Dysmenorrhea Symptoms: Cramping ?Contraception/Family planning: NuvaRing vaginal inserts ?Sexually active: yes ?Last Pap: 2016. Results were: normal ? ? ?Obstetric History ?OB History  ?Gravida Para Term Preterm AB Living  ?0 0 0 0 0 0  ?SAB IAB Ectopic Multiple Live Births  ?0 0 0 0 0  ? ? ? ?The following portions of the patient's history were reviewed and updated as appropriate: allergies, current medications, past family history, past medical history, past social history, past surgical history, and problem list. ? ?Review of Systems ?Pertinent items noted in HPI and remainder of comprehensive ROS otherwise negative.  ? ?Past medical history, past surgical history, family history and social history were all reviewed and documented in the EPIC chart. ? ? ?Exam: ? ?Vitals:  ? 05/14/21 0830  ?BP: 118/78  ?Weight: 214 lb (97.1 kg)  ?Height: 5' 4.75" (1.645 m)  ? ?Body mass index is 35.89 kg/m?. ? ?General appearance:  Normal ?Thyroid:  Symmetrical, normal in size, without palpable masses or nodularity. ?Respiratory ? Auscultation:  Clear without wheezing or rhonchi ?Cardiovascular ? Auscultation:  Regular rate, without rubs, murmurs or gallops ? Edema/varicosities:  Not grossly evident ?Abdominal ? Soft,nontender, without masses, guarding or rebound. ? Liver/spleen:  No organomegaly noted ? Hernia:  None appreciated ? Skin ? Inspection:  Grossly normal ?Breasts: Examined lying and sitting.  ? Right: Without masses, retractions, nipple discharge or axillary adenopathy. ? ? Left: Without masses,  retractions, nipple discharge or axillary adenopathy. ?Genitourinary  ? Inguinal/mons:  Normal without inguinal adenopathy ? External genitalia:  Normal appearing vulva with no masses, tenderness, or lesions ? BUS/Urethra/Skene's glands:  Normal without masses or exudate ? Vagina:  Normal appearing with normal color and discharge, no lesions ? Cervix:  Normal appearing without discharge or lesions, pap obtained ? Uterus:  Normal in size, shape and contour.  Mobile, nontender ? Adnexa/parametria:   ?  Rt: Normal in size, without masses or tenderness. ?  Lt: Normal in size, without masses or tenderness. ? Anus and perineum: Normal ?  ?Patient informed chaperone available to be present for breast and pelvic exam. Patient has requested no chaperone to be present. Patient has been advised what will be completed during breast and pelvic exam.  ? ?Assessment/Plan:   ?1. Well woman exam with routine gynecological exam ?Pap with reflex testing- will also check for BV ?- Cytology - PAP( [facility]) ? ?2. Routine screening for STI (sexually transmitted infection) ?With pap ? ?3. Encounter for surveillance of vaginal ring hormonal contraceptive device ?Continue Nuva Ring, rx sent  ? ? ?Discussed SBE, pap and STI screening as directed/appropriate. Recommend of exercise weekly, including weight bearing exercise. Encouraged the use of seatbelts and sunscreen. ?Return in 1 year for annual or as needed.  ? ?Tanda Rockers WHNP-BC 8:55 AM 05/14/2021  ?

## 2021-05-14 ENCOUNTER — Other Ambulatory Visit (HOSPITAL_COMMUNITY)
Admission: RE | Admit: 2021-05-14 | Discharge: 2021-05-14 | Disposition: A | Discharge status: Home / Self Care | Payer: 59 | Source: Ambulatory Visit | Attending: Radiology | Admitting: Radiology

## 2021-05-14 ENCOUNTER — Ambulatory Visit (INDEPENDENT_AMBULATORY_CARE_PROVIDER_SITE_OTHER): Payer: 59 | Admitting: Radiology

## 2021-05-14 ENCOUNTER — Encounter: Payer: Self-pay | Admitting: Radiology

## 2021-05-14 VITALS — BP 118/78 | Ht 64.75 in | Wt 214.0 lb

## 2021-05-14 DIAGNOSIS — Z3044 Encounter for surveillance of vaginal ring hormonal contraceptive device: Secondary | ICD-10-CM | POA: Diagnosis not present

## 2021-05-14 DIAGNOSIS — Z113 Encounter for screening for infections with a predominantly sexual mode of transmission: Secondary | ICD-10-CM

## 2021-05-14 DIAGNOSIS — Z01419 Encounter for gynecological examination (general) (routine) without abnormal findings: Secondary | ICD-10-CM | POA: Diagnosis present

## 2021-05-14 MED ORDER — ETONOGESTREL-ETHINYL ESTRADIOL 0.12-0.015 MG/24HR VA RING: 1.0000 | VAGINAL_RING | VAGINAL | 4 refills | Status: AC

## 2021-05-15 LAB — CYTOLOGY - PAP
Chlamydia: NEGATIVE
Comment: NEGATIVE
Comment: NEGATIVE
Comment: NORMAL
Diagnosis: NEGATIVE
Neisseria Gonorrhea: NEGATIVE
Trichomonas: NEGATIVE
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
## 2022-01-29 ENCOUNTER — Telehealth (INDEPENDENT_AMBULATORY_CARE_PROVIDER_SITE_OTHER): Payer: Commercial Managed Care - HMO | Admitting: Internal Medicine

## 2022-01-29 ENCOUNTER — Encounter: Payer: Self-pay | Admitting: Internal Medicine

## 2022-01-29 ENCOUNTER — Telehealth: Payer: Commercial Managed Care - HMO | Admitting: Internal Medicine

## 2022-01-29 DIAGNOSIS — M545 Low back pain, unspecified: Secondary | ICD-10-CM | POA: Diagnosis not present

## 2022-01-29 DIAGNOSIS — F419 Anxiety disorder, unspecified: Secondary | ICD-10-CM

## 2022-01-29 DIAGNOSIS — N62 Hypertrophy of breast: Secondary | ICD-10-CM

## 2022-01-29 NOTE — Progress Notes (Signed)
Error, see other note.

## 2022-01-29 NOTE — Progress Notes (Signed)
Patient ID: Lauren Mcknight, female   DOB: 31-Dec-1995, 26 y.o.   MRN: [id number]  Virtual Visit via Video Note  I connected with Lauren Mcknight on 01/29/22 at  3:20 PM EST by a video enabled telemedicine application and verified that I am speaking with the correct person using two identifiers.  Location of all participants today Patient: at home Provider: at office   I discussed the limitations of evaluation and management by telemedicine and the availability of in person appointments. The patient expressed understanding and agreed to proceed.  History of Present Illness: Here with c/o bilateral mid and lower back pain increasing over last 6 months, has gained wt, and breasts larger, hoping for referral for surgical reduction.  Pt denies chest pain, increased sob or doe, wheezing, orthopnea, PND, increased LE swelling, palpitations, dizziness or syncope.   Pt denies polydipsia, polyuria, or new focal neuro s/s.    Pt denies fever, wt loss, night sweats, loss of appetite, or other constitutional symptoms   Denies worsening depressive symptoms, suicidal ideation, or panic; has ongoing anxiety Past Medical History:  Diagnosis Date   ALLERGIC REACTION 07/20/2008   Qualifier: Diagnosis of  By: Shary Decamp     Allergy    Past Surgical History:  Procedure Laterality Date   APPENDECTOMY     BREAST LUMPECTOMY  2011    reports that she has never smoked. She has never used smokeless tobacco. She reports current alcohol use. She reports that she does not use drugs. family history includes Diabetes in her mother. Allergies  Allergen Reactions   Latex Anaphylaxis   Clindamycin Nausea Only   Naproxen Nausea Only   Current Outpatient Medications on File Prior to Visit  Medication Sig Dispense Refill   etonogestrel-ethinyl estradiol (NUVARING) 0.12-0.015 MG/24HR vaginal ring Place 1 each vaginally every 28 (twenty-eight) days. Insert vaginally and leave in place for 3 consecutive weeks, then remove for  1 week.     etonogestrel-ethinyl estradiol (NUVARING) 0.12-0.015 MG/24HR vaginal ring Place 1 each vaginally every 28 (twenty-eight) days. Insert vaginally and leave in place for 3 consecutive weeks, then remove for 1 week. 3 each 4   Multiple Vitamin (MULTIVITAMIN) capsule Take 1 capsule by mouth daily.     valACYclovir (VALTREX) 500 MG tablet Take 500 mg by mouth 2 (two) times daily.     No current facility-administered medications on file prior to visit.    Observations/Objective: Alert, NAD, appropriate mood and affect, resps normal, cn 2-12 intact, moves all 4s, no visible rash or swelling Lab Results  Component Value Date   WBC 9.9 01/20/2020   HGB 14.2 01/20/2020   HCT 43.8 01/20/2020   PLT 320.0 01/20/2020   CHOL 146 01/20/2020   TRIG 128.0 01/20/2020   HDL 54.20 01/20/2020   LDLCALC 66 01/20/2020   ALT 11 01/20/2020   AST 14 01/20/2020   Assessment and Plan: See notes  Follow Up Instructions: See notes   I discussed the assessment and treatment plan with the patient. The patient was provided an opportunity to ask questions and all were answered. The patient agreed with the plan and demonstrated an understanding of the instructions.   The patient was advised to call back or seek an in-person evaluation if the symptoms worsen or if the condition fails to improve as anticipated.   Lauren Barre, MD

## 2022-01-29 NOTE — Patient Instructions (Signed)
Error see other documentation

## 2022-01-29 NOTE — Assessment & Plan Note (Signed)
Worsening with increased back pain, ok for referral plastic surgury for reduction surgury

## 2022-01-29 NOTE — Assessment & Plan Note (Signed)
Chronic stable, declines need for tx change or referral

## 2022-01-29 NOTE — Assessment & Plan Note (Signed)
Pt for tylenol prn

## 2022-01-29 NOTE — Patient Instructions (Signed)
You will be contacted regarding the referral for: plastic surgury

## 2022-02-12 ENCOUNTER — Telehealth: Payer: Commercial Managed Care - HMO | Admitting: Internal Medicine

## 2022-02-26 ENCOUNTER — Telehealth: Payer: Self-pay | Admitting: Internal Medicine

## 2022-02-26 MED ORDER — MOLNUPIRAVIR EUA 200MG CAPSULE
4.0000 | ORAL_CAPSULE | Freq: Two times a day (BID) | ORAL | 0 refills | Status: AC
Start: 2022-02-26 — End: 2022-03-03

## 2022-02-26 NOTE — Telephone Encounter (Signed)
Pt has no recent documentation of renal fxn  I cannot send paxlovid, but I did send Molnupiravir - [facility]

## 2022-02-26 NOTE — Telephone Encounter (Signed)
Pt's mother states she was here on Friday 01.12.24 and reported sinus issues. Pt's mother is now testing positive for Covid, and she lives with the pt who is also testing positive.   They are requesting we send antibiotics to the  [facility]  Phone: [phone]

## 2022-02-26 NOTE — Telephone Encounter (Signed)
Patient informed of Rx for covid 

## 2022-02-26 NOTE — Telephone Encounter (Signed)
Patients mother was seen on 02/22/22

## 2022-03-14 DIAGNOSIS — H16223 Keratoconjunctivitis sicca, not specified as Sjogren's, bilateral: Secondary | ICD-10-CM | POA: Diagnosis not present

## 2022-03-28 ENCOUNTER — Telehealth: Payer: Self-pay | Admitting: Internal Medicine

## 2022-03-28 NOTE — Telephone Encounter (Signed)
Patient is going to have breast reduction surgery - Dr. Marica Otter - plastic surgery office is requesting all office notes detailing her complaining about her back pain because of her breast size.  Fax # [phone]

## 2022-03-29 NOTE — Telephone Encounter (Signed)
Office notes faxed to [facility] office

## 2022-04-05 DIAGNOSIS — N62 Hypertrophy of breast: Secondary | ICD-10-CM | POA: Diagnosis not present

## 2022-04-12 ENCOUNTER — Telehealth: Payer: Self-pay | Admitting: Internal Medicine

## 2022-04-12 DIAGNOSIS — E7800 Pure hypercholesterolemia, unspecified: Secondary | ICD-10-CM

## 2022-04-12 DIAGNOSIS — E559 Vitamin D deficiency, unspecified: Secondary | ICD-10-CM

## 2022-04-12 DIAGNOSIS — R739 Hyperglycemia, unspecified: Secondary | ICD-10-CM

## 2022-04-12 DIAGNOSIS — E538 Deficiency of other specified B group vitamins: Secondary | ICD-10-CM

## 2022-04-12 NOTE — Telephone Encounter (Signed)
Patient informed of lab orders

## 2022-04-12 NOTE — Telephone Encounter (Signed)
Patient would like to have blood work done prior to her appointment on 04/17/22.  Best callback number is [phone].

## 2022-04-12 NOTE — Telephone Encounter (Signed)
[facility] labs are in

## 2022-04-15 ENCOUNTER — Other Ambulatory Visit (INDEPENDENT_AMBULATORY_CARE_PROVIDER_SITE_OTHER): Payer: 59

## 2022-04-15 DIAGNOSIS — E7800 Pure hypercholesterolemia, unspecified: Secondary | ICD-10-CM | POA: Diagnosis not present

## 2022-04-15 DIAGNOSIS — E559 Vitamin D deficiency, unspecified: Secondary | ICD-10-CM

## 2022-04-15 DIAGNOSIS — R739 Hyperglycemia, unspecified: Secondary | ICD-10-CM

## 2022-04-15 DIAGNOSIS — E538 Deficiency of other specified B group vitamins: Secondary | ICD-10-CM | POA: Diagnosis not present

## 2022-04-15 LAB — CBC WITH DIFFERENTIAL/PLATELET
Basophils Absolute: 0.1 10*3/uL (ref 0.0–0.1)
Basophils Relative: 1.2 % (ref 0.0–3.0)
Eosinophils Absolute: 0.1 10*3/uL (ref 0.0–0.7)
Eosinophils Relative: 1.1 % (ref 0.0–5.0)
HCT: 41.1 % (ref 36.0–46.0)
Hemoglobin: 13.5 g/dL (ref 12.0–15.0)
Lymphocytes Relative: 37.1 % (ref 12.0–46.0)
Lymphs Abs: 3.5 10*3/uL (ref 0.7–4.0)
MCHC: 32.9 g/dL (ref 30.0–36.0)
MCV: 90.3 fl (ref 78.0–100.0)
Monocytes Absolute: 0.4 10*3/uL (ref 0.1–1.0)
Monocytes Relative: 4.3 % (ref 3.0–12.0)
Neutro Abs: 5.4 10*3/uL (ref 1.4–7.7)
Neutrophils Relative %: 56.3 % (ref 43.0–77.0)
Platelets: 319 10*3/uL (ref 150.0–400.0)
RBC: 4.55 Mil/uL (ref 3.87–5.11)
RDW: 13.6 % (ref 11.5–15.5)
WBC: 9.6 10*3/uL (ref 4.0–10.5)

## 2022-04-15 LAB — LIPID PANEL
Cholesterol: 145 mg/dL (ref 0–200)
HDL: 59.7 mg/dL (ref >39.00)
LDL Cholesterol: 68 mg/dL (ref 0–99)
NonHDL: 85.37
Total CHOL/HDL Ratio: 2
Triglycerides: 87 mg/dL (ref 0.0–149.0)
VLDL: 17.4 mg/dL (ref 0.0–40.0)

## 2022-04-15 LAB — BASIC METABOLIC PANEL
BUN: 9 mg/dL (ref 6–23)
CO2: 25 mEq/L (ref 19–32)
Calcium: 10.1 mg/dL (ref 8.4–10.5)
Chloride: 105 mEq/L (ref 96–112)
Creatinine, Ser: 0.75 mg/dL (ref 0.40–1.20)
GFR: 108.35 mL/min (ref >60.00)
Glucose, Bld: 95 mg/dL (ref 70–99)
Potassium: 4 mEq/L (ref 3.5–5.1)
Sodium: 140 mEq/L (ref 135–145)

## 2022-04-15 LAB — HEMOGLOBIN A1C: Hgb A1c MFr Bld: 5.4 % (ref 4.6–6.5)

## 2022-04-15 LAB — HEPATIC FUNCTION PANEL
ALT: 10 U/L (ref 0–35)
AST: 13 U/L (ref 0–37)
Albumin: 3.9 g/dL (ref 3.5–5.2)
Alkaline Phosphatase: 74 U/L (ref 39–117)
Bilirubin, Direct: 0.1 mg/dL (ref 0.0–0.3)
Total Bilirubin: 0.2 mg/dL (ref 0.2–1.2)
Total Protein: 7.5 g/dL (ref 6.0–8.3)

## 2022-04-15 LAB — VITAMIN B12: Vitamin B-12: 126 pg/mL — ABNORMAL LOW (ref 211–911)

## 2022-04-15 LAB — TSH: TSH: 1.54 u[IU]/mL (ref 0.35–5.50)

## 2022-04-15 LAB — VITAMIN D 25 HYDROXY (VIT D DEFICIENCY, FRACTURES): VITD: 14.06 ng/mL — ABNORMAL LOW (ref 30.00–100.00)

## 2022-04-16 LAB — URINALYSIS, ROUTINE W REFLEX MICROSCOPIC
Bilirubin Urine: NEGATIVE
Ketones, ur: NEGATIVE
Nitrite: NEGATIVE
Specific Gravity, Urine: 1.025 (ref 1.000–1.030)
Total Protein, Urine: NEGATIVE
Urine Glucose: NEGATIVE
Urobilinogen, UA: 0.2 (ref 0.0–1.0)
pH: 6 (ref 5.0–8.0)

## 2022-04-17 ENCOUNTER — Encounter: Payer: Self-pay | Admitting: Internal Medicine

## 2022-04-17 ENCOUNTER — Ambulatory Visit (INDEPENDENT_AMBULATORY_CARE_PROVIDER_SITE_OTHER): Payer: 59 | Admitting: Internal Medicine

## 2022-04-17 VITALS — BP 120/68 | HR 87 | Temp 98.3°F | Ht 64.75 in | Wt 217.0 lb

## 2022-04-17 DIAGNOSIS — E538 Deficiency of other specified B group vitamins: Secondary | ICD-10-CM | POA: Diagnosis not present

## 2022-04-17 DIAGNOSIS — R7989 Other specified abnormal findings of blood chemistry: Secondary | ICD-10-CM | POA: Diagnosis not present

## 2022-04-17 DIAGNOSIS — Z0000 Encounter for general adult medical examination without abnormal findings: Secondary | ICD-10-CM | POA: Diagnosis not present

## 2022-04-17 DIAGNOSIS — M722 Plantar fascial fibromatosis: Secondary | ICD-10-CM | POA: Diagnosis not present

## 2022-04-17 DIAGNOSIS — M79672 Pain in left foot: Secondary | ICD-10-CM

## 2022-04-17 DIAGNOSIS — M79671 Pain in right foot: Secondary | ICD-10-CM

## 2022-04-17 DIAGNOSIS — Z0001 Encounter for general adult medical examination with abnormal findings: Secondary | ICD-10-CM

## 2022-04-17 DIAGNOSIS — E7800 Pure hypercholesterolemia, unspecified: Secondary | ICD-10-CM

## 2022-04-17 DIAGNOSIS — R739 Hyperglycemia, unspecified: Secondary | ICD-10-CM

## 2022-04-17 DIAGNOSIS — N62 Hypertrophy of breast: Secondary | ICD-10-CM

## 2022-04-17 MED ORDER — MELOXICAM 15 MG PO TABS: 15.0000 mg | ORAL_TABLET | Freq: Every day | ORAL | 1 refills | Status: DC | PRN

## 2022-04-17 NOTE — Patient Instructions (Addendum)
Please take OTC Vitamin D3 at 2000 units per day, indefinitely, and the B12 1000 mcg per day  Please take all new medication as prescribed - the mobic 15 mg per day as needed  Please continue all other medications as before, and refills have been done if requested.  Please have the pharmacy call with any other refills you may need.  Please continue your efforts at being more active, low cholesterol diet, and weight control.  You are otherwise up to date with prevention measures today.  Please keep your appointments with your specialists as you may have planned - Plastic Surgury  You will be contacted regarding the referral for: Sports medicine  Please make an Appointment to return for your 1 year visit, or sooner if needed, with Lab testing by Appointment as well, to be done about 3-5 days before at the [facility] (so this is for TWO appointments - please see the scheduling desk as you leave)

## 2022-04-17 NOTE — Progress Notes (Signed)
Patient ID: Lauren Mcknight, female   DOB: May 22, 1995, 27 y.o.   MRN: [id number]         Chief Complaint:: wellness exam and Physical (Concerns of large breasts, weight concerns)  , low vit d, low B12, bilateral plantar fasciitis       HPI:  Lauren Mcknight is a 27 y.o. female here for wellness exam; declines tdap, covid booster, flu shot, hep c and hiv screening, o/w up to date                Also has difficulty losing wt with diet and exercise, has bilateral plantar feet pain moderate intermittent worse with first steps in the am, better with more steps for over 1 mo.  Not taking Vit D or B12.  Has onoing issue with mod to occas severe chronic upper back and shoulder pain that seems related to large breasts, plans to f/u with plastic surgury soon for surgical management.     Wt Readings from Last 3 Encounters:  04/17/22 217 lb (98.4 kg)  05/14/21 214 lb (97.1 kg)  06/12/20 208 lb (94.3 kg)   BP Readings from Last 3 Encounters:  04/17/22 120/68  05/14/21 118/78  06/12/20 118/80   Immunization History  Administered Date(s) Administered   Meningococcal Conjugate 09/03/2011   PFIZER(Purple Top)SARS-COV-2 Vaccination 04/01/2019, 05/27/2019   PPD Test 12/16/2019   Tdap 09/03/2011   Health Maintenance Due  Topic Date Due   DTaP/Tdap/Td (2 - Td or Tdap) 09/02/2021      Past Medical History:  Diagnosis Date   ALLERGIC REACTION 07/20/2008   Qualifier: Diagnosis of  By: Dawson Bills     Allergy    Past Surgical History:  Procedure Laterality Date   APPENDECTOMY     BREAST LUMPECTOMY  2011    reports that she has never smoked. She has never used smokeless tobacco. She reports current alcohol use. She reports that she does not use drugs. family history includes Diabetes in her mother. Allergies  Allergen Reactions   Latex Anaphylaxis   Bee Venom Hives, Itching and Swelling   Clindamycin Nausea Only   Naproxen Nausea Only   Current Outpatient Medications on File Prior to Visit   Medication Sig Dispense Refill   etonogestrel-ethinyl estradiol (NUVARING) 0.12-0.015 MG/24HR vaginal ring Place 1 each vaginally every 28 (twenty-eight) days. Insert vaginally and leave in place for 3 consecutive weeks, then remove for 1 week.     etonogestrel-ethinyl estradiol (NUVARING) 0.12-0.015 MG/24HR vaginal ring Place 1 each vaginally every 28 (twenty-eight) days. Insert vaginally and leave in place for 3 consecutive weeks, then remove for 1 week. 3 each 4   valACYclovir (VALTREX) 500 MG tablet Take 500 mg by mouth 2 (two) times daily.     Vitamins-Lipotropics (MULTI-VITAMIN HP/MINERALS) CAPS Take by mouth.     No current facility-administered medications on file prior to visit.        ROS:  All others reviewed and negative.  Objective        PE:  BP 120/68 (BP Location: Right Arm, Patient Position: Sitting, Cuff Size: Large)   Pulse 87   Temp 98.3 F (36.8 C) (Oral)   Ht 5' 4.75" (1.645 m)   Wt 217 lb (98.4 kg)   SpO2 97%   BMI 36.39 kg/m                 Constitutional: Pt appears in NAD  HENT: Head: NCAT.                Right Ear: External ear normal.                 Left Ear: External ear normal.                Eyes: . Pupils are equal, round, and reactive to light. Conjunctivae and EOM are normal               Nose: without d/c or deformity               Neck: Neck supple. Gross normal ROM               Cardiovascular: Normal rate and regular rhythm.                 Pulmonary/Chest: Effort normal and breath sounds without rales or wheezing.                Abd:  Soft, NT, ND, + BS, no organomegaly               Neurological: Pt is alert. At baseline orientation, motor grossly intact               Skin: Skin is warm. No rashes, no other new lesions, LE edema - none                Psychiatric: Pt behavior is normal without agitation   Micro: none  Cardiac tracings I have personally interpreted today:  none  Pertinent Radiological findings (summarize):  none   Lab Results  Component Value Date   WBC 9.6 04/15/2022   HGB 13.5 04/15/2022   HCT 41.1 04/15/2022   PLT 319.0 04/15/2022   GLUCOSE 95 04/15/2022   CHOL 145 04/15/2022   TRIG 87.0 04/15/2022   HDL 59.70 04/15/2022   LDLCALC 68 04/15/2022   ALT 10 04/15/2022   AST 13 04/15/2022   NA 140 04/15/2022   K 4.0 04/15/2022   CL 105 04/15/2022   CREATININE 0.75 04/15/2022   BUN 9 04/15/2022   CO2 25 04/15/2022   TSH 1.54 04/15/2022   HGBA1C 5.4 04/15/2022   Assessment/Plan:  Lauren Mcknight is a 27 y.o. Black or African American [2] female with  has a past medical history of ALLERGIC REACTION (07/20/2008) and Allergy.  Encounter for well adult exam with abnormal findings Age and sex appropriate education and counseling updated with regular exercise and diet Referrals for preventative services - declines hep c or hiv screen today Immunizations addressed - declines covid booster or flu shot Smoking counseling  - none needed Evidence for depression or other mood disorder - none significant Most recent labs reviewed. I have personally reviewed and have noted: 1) the patient's medical and social history 2) The patient's current medications and supplements 3) The patient's height, weight, and BMI have been recorded in the chart   Large breasts Pt for f/u soon with plastic surgury for surgical management  Low vitamin D level Last vitamin D Lab Results  Component Value Date   VD25OH 14.06 (L) 04/15/2022   Low, to start oral replacement   B12 deficiency Lab Results  Component Value Date   VITAMINB12 126 (L) 04/15/2022   Low, to start oral replacement - b12 1000 mcg qd   Plantar fasciitis, bilateral Mild to mod, for mobic prn, refer sports medicine,  to f/u any worsening symptoms or concerns  HLD (hyperlipidemia) Lab Results  Component Value Date   LDLCALC 68 04/15/2022   Stable, pt to continue current statin    Followup: Return in about 1 year (around  04/17/2023).  Cathlean Cower, MD 04/19/2022 9:37 PM [facility]

## 2022-04-19 ENCOUNTER — Encounter: Payer: Self-pay | Admitting: Internal Medicine

## 2022-04-19 DIAGNOSIS — R7989 Other specified abnormal findings of blood chemistry: Secondary | ICD-10-CM | POA: Insufficient documentation

## 2022-04-19 DIAGNOSIS — E785 Hyperlipidemia, unspecified: Secondary | ICD-10-CM | POA: Insufficient documentation

## 2022-04-19 DIAGNOSIS — M722 Plantar fascial fibromatosis: Secondary | ICD-10-CM | POA: Insufficient documentation

## 2022-04-19 DIAGNOSIS — Z0001 Encounter for general adult medical examination with abnormal findings: Secondary | ICD-10-CM | POA: Insufficient documentation

## 2022-04-19 DIAGNOSIS — E538 Deficiency of other specified B group vitamins: Secondary | ICD-10-CM | POA: Insufficient documentation

## 2022-04-19 NOTE — Assessment & Plan Note (Signed)
Lab Results  Component Value Date   LDLCALC 68 04/15/2022   Stable, pt to continue current statin

## 2022-04-19 NOTE — Assessment & Plan Note (Signed)
Last vitamin D Lab Results  Component Value Date   VD25OH 14.06 (L) 04/15/2022   Low, to start oral replacement

## 2022-04-19 NOTE — Assessment & Plan Note (Signed)
Age and sex appropriate education and counseling updated with regular exercise and diet Referrals for preventative services - declines hep c or hiv screen today Immunizations addressed - declines covid booster or flu shot Smoking counseling  - none needed Evidence for depression or other mood disorder - none significant Most recent labs reviewed. I have personally reviewed and have noted: 1) the patient's medical and social history 2) The patient's current medications and supplements 3) The patient's height, weight, and BMI have been recorded in the chart

## 2022-04-19 NOTE — Assessment & Plan Note (Signed)
Mild to mod, for mobic prn, refer sports medicine,  to f/u any worsening symptoms or concerns

## 2022-04-19 NOTE — Assessment & Plan Note (Signed)
Lab Results  Component Value Date   VITAMINB12 126 (L) 04/15/2022   Low, to start oral replacement - b12 1000 mcg qd

## 2022-04-19 NOTE — Assessment & Plan Note (Signed)
Pt for f/u soon with plastic surgury for surgical management

## 2022-04-29 ENCOUNTER — Encounter: Payer: Self-pay | Admitting: Family Medicine

## 2022-05-27 ENCOUNTER — Other Ambulatory Visit: Payer: Self-pay | Admitting: Obstetrics & Gynecology

## 2022-05-27 DIAGNOSIS — R1011 Right upper quadrant pain: Secondary | ICD-10-CM | POA: Diagnosis not present

## 2022-05-27 DIAGNOSIS — Z6836 Body mass index (BMI) 36.0-36.9, adult: Secondary | ICD-10-CM | POA: Diagnosis not present

## 2022-05-27 DIAGNOSIS — Z01419 Encounter for gynecological examination (general) (routine) without abnormal findings: Secondary | ICD-10-CM | POA: Diagnosis not present

## 2022-06-19 NOTE — Progress Notes (Unsigned)
[facility] [address] [place] [address] Phone: [phone] Subjective:   Lauren Mcknight, am serving as a scribe for Dr. Antoine Primas.  I'm seeing this patient by the request  of:  Corwin Levins, MD  CC: bilateral heel pain   [id number]  Lauren Mcknight is a 27 y.o. female coming in with complaint of B heel pain. About 3 months ago started really getting bad. Throbbing pain. Constantly stays the same intensity. Meloxicam didn't work. Ibuprofen works a little. Flexeril and magnesium help.     Past Medical History:  Diagnosis Date   ALLERGIC REACTION 07/20/2008   Qualifier: Diagnosis of  By: Shary Decamp     Allergy    Past Surgical History:  Procedure Laterality Date   APPENDECTOMY     BREAST LUMPECTOMY  2011   Social History   Socioeconomic History   Marital status: Single    Spouse name: Not on file   Number of children: Not on file   Years of education: Not on file   Highest education level: Not on file  Occupational History   Not on file  Tobacco Use   Smoking status: Never   Smokeless tobacco: Never  Substance and Sexual Activity   Alcohol use: Yes    Comment: occasionally   Drug use: No   Sexual activity: Yes    Partners: Male    Birth control/protection: Other-see comments    Comment: nuvaring  Other Topics Concern   Not on file  Social History Narrative   Not on file   Social Determinants of Health   Financial Resource Strain: Not on file  Food Insecurity: Not on file  Transportation Needs: Not on file  Physical Activity: Not on file  Stress: Not on file  Social Connections: Not on file   Allergies  Allergen Reactions   Latex Anaphylaxis   Bee Venom Hives, Itching and Swelling   Clindamycin Nausea Only   Naproxen Nausea Only   Family History  Problem Relation Age of Onset   Diabetes Mother    Alcohol abuse Neg Hx    Cancer Neg Hx    Heart disease Neg Hx    Hyperlipidemia Neg Hx     Hypertension Neg Hx    Stroke Neg Hx     Current Outpatient Medications (Endocrine & Metabolic):    predniSONE (DELTASONE) 20 MG tablet, Take 2 tablets (40 mg total) by mouth daily with breakfast.   etonogestrel-ethinyl estradiol (NUVARING) 0.12-0.015 MG/24HR vaginal ring, Place 1 each vaginally every 28 (twenty-eight) days. Insert vaginally and leave in place for 3 consecutive weeks, then remove for 1 week.   etonogestrel-ethinyl estradiol (NUVARING) 0.12-0.015 MG/24HR vaginal ring, Place 1 each vaginally every 28 (twenty-eight) days. Insert vaginally and leave in place for 3 consecutive weeks, then remove for 1 week.    Current Outpatient Medications (Analgesics):    meloxicam (MOBIC) 15 MG tablet, Take 1 tablet (15 mg total) by mouth daily as needed for pain.   Current Outpatient Medications (Other):    valACYclovir (VALTREX) 500 MG tablet, Take 500 mg by mouth 2 (two) times daily.   Vitamins-Lipotropics (MULTI-VITAMIN HP/MINERALS) CAPS, Take by mouth.   Reviewed prior external information including notes and imaging from  primary care provider As well as notes that were available from care everywhere and other healthcare systems.  Past medical history, social, surgical and family history all reviewed in electronic medical record.  No pertanent information unless stated regarding  to the chief complaint.   Review of Systems:  No headache, visual changes, nausea, vomiting, diarrhea, constipation, dizziness, abdominal pain, skin rash, fevers, chills, night sweats, weight loss, swollen lymph nodes, body aches, joint swelling, chest pain, shortness of breath, mood changes. POSITIVE muscle aches  Objective  Blood pressure 124/82, pulse 95, height 5\' 4"  (1.626 m), weight 217 lb (98.4 kg), SpO2 96 %.   General: No apparent distress alert and oriented x3 mood and affect normal, dressed appropriately.  HEENT: Pupils equal, extraocular movements intact  Respiratory: Patient's speak in full  sentences and does not appear short of breath  Cardiovascular: No lower extremity edema, non tender, no erythema  Heel exam TTP on the medial aspect calcaneal region.  Patient does have some tightness noted of the posterior cord of the ankles bilaterally.  Does have swelling noted as well.  Limited muscular skeletal ultrasound was performed and interpreted by Antoine Primas, M  Limited ultrasound shows patient does have hypoechoic changes of the plantar fascia but no significant swelling noted.  No significant thickening noted.  Patient does have significant hypoechoic changes of the superficial fat pad that does seem to be significant for patient's size.  Seems to be bilateral.  No cortical irregularity of the calcaneal area. Impression: Mild plantar fasciitis with fat pad irritation  [id number]; 15 additional minutes spent for Therapeutic exercises as stated in above notes.  This included exercises focusing on stretching, strengthening, with significant focus on eccentric aspects.   Long term goals include an improvement in range of motion, strength, endurance as well as avoiding reinjury. Patient's frequency would include in 1-2 times a day, 3-5 times a week for a duration of 6-12 weeks. Exercises for the foot include:  Stretches to help lengthen the lower leg and plantar fascia areas Theraband exercises for the lower leg and ankle to help strengthen the surrounding area- dorsiflexion, plantarflexion, inversion, eversion Massage rolling on the plantar surface of the foot with a frozen bottle, tennis ball or golf ball Towel or marble pick-ups to strengthen the plantar surface of the foot Weight bearing exercises to increase balance and overall stability    Proper technique shown and discussed handout in great detail with ATC.  All questions were discussed and answered.     Impression and Recommendations:     The above documentation has been reviewed and is accurate and complete Judi Saa,  DO

## 2022-06-20 ENCOUNTER — Ambulatory Visit: Payer: 59 | Admitting: Family Medicine

## 2022-06-20 ENCOUNTER — Other Ambulatory Visit: Payer: Self-pay

## 2022-06-20 VITALS — BP 124/82 | HR 95 | Ht 64.0 in | Wt 217.0 lb

## 2022-06-20 DIAGNOSIS — M722 Plantar fascial fibromatosis: Secondary | ICD-10-CM | POA: Diagnosis not present

## 2022-06-20 DIAGNOSIS — M79672 Pain in left foot: Secondary | ICD-10-CM

## 2022-06-20 DIAGNOSIS — M79671 Pain in right foot: Secondary | ICD-10-CM | POA: Diagnosis not present

## 2022-06-20 MED ORDER — PREDNISONE 20 MG PO TABS: 40.0000 mg | ORAL_TABLET | Freq: Every day | ORAL | 0 refills | Status: DC

## 2022-06-20 NOTE — Patient Instructions (Addendum)
40mg  Prednisone for 5 days [place] recovery sandals in house [place], [place], or other rigid sole shoes See you again in 6-8 weeks

## 2022-06-20 NOTE — Assessment & Plan Note (Addendum)
Patient does have what appears to be plantar fasciitis but also has what appears to be fat pad irritation.  Discussed with patient-proper shoes, recovery sandals, home exercises given.  Due to the severity of fat pad irritation did give prednisone.  Worsening pain consider injection and formal physical therapy.

## 2022-06-24 ENCOUNTER — Inpatient Hospital Stay
Admission: RE | Admit: 2022-06-24 | Discharge: 2022-06-24 | Disposition: A | Discharge status: Home / Self Care | Payer: 59 | Source: Ambulatory Visit | Attending: Internal Medicine | Admitting: Internal Medicine

## 2022-06-24 ENCOUNTER — Telehealth: Payer: Self-pay

## 2022-06-24 NOTE — Telephone Encounter (Signed)
Called pt to schedule for TB nurse visit, notice that pt already schedule one and cancelled, spoke to pt and pt stated she did cancelled it. Asked if she want to schedule one for the future pt stated no she does not want to.

## 2022-06-24 NOTE — Telephone Encounter (Signed)
Pt has called and stated she has a form that is needing to be filled out for her Job with [employer]. Pt is also needing a TB test done as well. I informed pt she is welcome to drop form off for provider to look at and fill out. However, she made need to see provider or have a nurse visit to get TB test, it will be the provider to make final decision on if pt needs an OV or Nurse.

## 2022-06-24 NOTE — Telephone Encounter (Signed)
For is done, but ok for Nurse visit if she needs the Tb skin testing, thanks

## 2022-06-25 ENCOUNTER — Ambulatory Visit: Payer: 59 | Admitting: Internal Medicine

## 2022-08-09 NOTE — Progress Notes (Deleted)
[facility] [address] [place] [address] Phone: [phone] Subjective:    I'm seeing this patient by the request  of:  Corwin Levins, MD  CC:   [id number]  06/20/2022 Patient does have what appears to be plantar fasciitis but also has what appears to be fat pad irritation.  Discussed with patient-proper shoes, recovery sandals, home exercises given.  Due to the severity of fat pad irritation did give prednisone.  Worsening pain consider injection and formal physical therapy.   Updated 08/13/2022 Lauren Mcknight is a 27 y.o. female coming in with complaint of heel pain       Past Medical History:  Diagnosis Date   ALLERGIC REACTION 07/20/2008   Qualifier: Diagnosis of  By: Shary Decamp     Allergy    Past Surgical History:  Procedure Laterality Date   APPENDECTOMY     BREAST LUMPECTOMY  2011   Social History   Socioeconomic History   Marital status: Single    Spouse name: Not on file   Number of children: Not on file   Years of education: Not on file   Highest education level: Not on file  Occupational History   Not on file  Tobacco Use   Smoking status: Never   Smokeless tobacco: Never  Substance and Sexual Activity   Alcohol use: Yes    Comment: occasionally   Drug use: No   Sexual activity: Yes    Partners: Male    Birth control/protection: Other-see comments    Comment: nuvaring  Other Topics Concern   Not on file  Social History Narrative   Not on file   Social Determinants of Health   Financial Resource Strain: Not on file  Food Insecurity: Not on file  Transportation Needs: Not on file  Physical Activity: Not on file  Stress: Not on file  Social Connections: Not on file   Allergies  Allergen Reactions   Latex Anaphylaxis   Bee Venom Hives, Itching and Swelling   Clindamycin Nausea Only   Naproxen Nausea Only   Family History  Problem Relation Age of Onset   Diabetes Mother    Alcohol abuse Neg Hx     Cancer Neg Hx    Heart disease Neg Hx    Hyperlipidemia Neg Hx    Hypertension Neg Hx    Stroke Neg Hx     Current Outpatient Medications (Endocrine & Metabolic):    etonogestrel-ethinyl estradiol (NUVARING) 0.12-0.015 MG/24HR vaginal ring, Place 1 each vaginally every 28 (twenty-eight) days. Insert vaginally and leave in place for 3 consecutive weeks, then remove for 1 week.   etonogestrel-ethinyl estradiol (NUVARING) 0.12-0.015 MG/24HR vaginal ring, Place 1 each vaginally every 28 (twenty-eight) days. Insert vaginally and leave in place for 3 consecutive weeks, then remove for 1 week.   predniSONE (DELTASONE) 20 MG tablet, Take 2 tablets (40 mg total) by mouth daily with breakfast.    Current Outpatient Medications (Analgesics):    meloxicam (MOBIC) 15 MG tablet, Take 1 tablet (15 mg total) by mouth daily as needed for pain.   Current Outpatient Medications (Other):    valACYclovir (VALTREX) 500 MG tablet, Take 500 mg by mouth 2 (two) times daily.   Vitamins-Lipotropics (MULTI-VITAMIN HP/MINERALS) CAPS, Take by mouth.   Reviewed prior external information including notes and imaging from  primary care provider As well as notes that were available from care everywhere and other healthcare systems.  Past medical history, social, surgical and  family history all reviewed in electronic medical record.  No pertanent information unless stated regarding to the chief complaint.   Review of Systems:  No headache, visual changes, nausea, vomiting, diarrhea, constipation, dizziness, abdominal pain, skin rash, fevers, chills, night sweats, weight loss, swollen lymph nodes, body aches, joint swelling, chest pain, shortness of breath, mood changes. POSITIVE muscle aches  Objective  There were no vitals taken for this visit.   General: No apparent distress alert and oriented x3 mood and affect normal, dressed appropriately.  HEENT: Pupils equal, extraocular movements intact  Respiratory:  Patient's speak in full sentences and does not appear short of breath  Cardiovascular: No lower extremity edema, non tender, no erythema      Impression and Recommendations:

## 2022-08-13 ENCOUNTER — Ambulatory Visit: Payer: 59 | Admitting: Family Medicine

## 2022-11-12 ENCOUNTER — Ambulatory Visit: Payer: BC Managed Care – PPO | Admitting: Internal Medicine

## 2022-11-12 ENCOUNTER — Encounter: Payer: Self-pay | Admitting: Internal Medicine

## 2022-11-12 VITALS — BP 118/70 | HR 70 | Temp 98.9°F | Ht 64.0 in | Wt 211.0 lb

## 2022-11-12 DIAGNOSIS — F419 Anxiety disorder, unspecified: Secondary | ICD-10-CM

## 2022-11-12 DIAGNOSIS — E538 Deficiency of other specified B group vitamins: Secondary | ICD-10-CM

## 2022-11-12 DIAGNOSIS — J329 Chronic sinusitis, unspecified: Secondary | ICD-10-CM | POA: Diagnosis not present

## 2022-11-12 DIAGNOSIS — R7989 Other specified abnormal findings of blood chemistry: Secondary | ICD-10-CM

## 2022-11-12 MED ORDER — DOXYCYCLINE HYCLATE 100 MG PO TABS: 100.0000 mg | ORAL_TABLET | Freq: Two times a day (BID) | ORAL | 0 refills | Status: DC

## 2022-11-12 NOTE — Progress Notes (Unsigned)
Patient ID: Lauren Mcknight, female   DOB: 1995-06-19, 27 y.o.   MRN: [id number]        Chief Complaint: follow up with URI symptoms but also facial redness ? Early right mid face abscess       HPI:  Lauren Mcknight is a 27 y.o. female here with c/o 2-3 days onset feverish, HA, fatigue, feeling poorly, sinus congestion with yellow green, bilat ear fullness and discomfort and sinus pressure, but also bilateral facial redness, tender and right mid cheek with sligthly raised ? Early fluctuant area.  Also has upper neck soreness and lumps as well.   Pt denies chest pain, increased sob or doe, wheezing, orthopnea, PND, increased LE swelling, palpitations, dizziness or syncope.   Pt denies polydipsia, polyuria, or new focal neuro s/s.    Pt denies recent, wt loss, night sweats, loss of appetite, or other constitutional symptoms        Wt Readings from Last 3 Encounters:  11/12/22 211 lb (95.7 kg)  06/20/22 217 lb (98.4 kg)  04/17/22 217 lb (98.4 kg)   BP Readings from Last 3 Encounters:  11/12/22 118/70  06/20/22 124/82  04/17/22 120/68         Past Medical History:  Diagnosis Date   ALLERGIC REACTION 07/20/2008   Qualifier: Diagnosis of  By: Shary Decamp     Allergy    Past Surgical History:  Procedure Laterality Date   APPENDECTOMY     BREAST LUMPECTOMY  2011    reports that she has never smoked. She has never used smokeless tobacco. She reports current alcohol use. She reports that she does not use drugs. family history includes Diabetes in her mother. Allergies  Allergen Reactions   Latex Anaphylaxis   Bee Venom Hives, Itching and Swelling   Clindamycin Nausea Only   Naproxen Nausea Only   Current Outpatient Medications on File Prior to Visit  Medication Sig Dispense Refill   etonogestrel-ethinyl estradiol (NUVARING) 0.12-0.015 MG/24HR vaginal ring Place 1 each vaginally every 28 (twenty-eight) days. Insert vaginally and leave in place for 3 consecutive weeks, then remove for 1 week.      etonogestrel-ethinyl estradiol (NUVARING) 0.12-0.015 MG/24HR vaginal ring Place 1 each vaginally every 28 (twenty-eight) days. Insert vaginally and leave in place for 3 consecutive weeks, then remove for 1 week. 3 each 4   meloxicam (MOBIC) 15 MG tablet Take 1 tablet (15 mg total) by mouth daily as needed for pain. 90 tablet 1   predniSONE (DELTASONE) 20 MG tablet Take 2 tablets (40 mg total) by mouth daily with breakfast. 10 tablet 0   valACYclovir (VALTREX) 500 MG tablet Take 500 mg by mouth 2 (two) times daily.     Vitamins-Lipotropics (MULTI-VITAMIN HP/MINERALS) CAPS Take by mouth.     No current facility-administered medications on file prior to visit.        ROS:  All others reviewed and negative.  Objective        PE:  BP 118/70 (BP Location: Right Arm, Patient Position: Sitting, Cuff Size: Normal)   Pulse 70   Temp 98.9 F (37.2 C) (Oral)   Ht 5\' 4"  (1.626 m)   Wt 211 lb (95.7 kg)   LMP 11/10/2022 (Exact Date)   SpO2 98%   BMI 36.22 kg/m                 Constitutional: Pt appears in NAD  HENT: Head: NCAT.                Right Ear: External ear normal.                 Left Ear: External ear normal.  Bilat tm's with mild erythema.  Max sinus areas mild tender.  Pharynx with mild erythema, no exudate                 Eyes: . Pupils are equal, round, and reactive to light. Conjunctivae and EOM are normal               Nose: without d/c or deformity; bilateral facial redness, tender and right mid cheek with sligthly raised ? Early fluctuant area, also mild tender bilat submandibular La right > left               Neck: Neck supple. Gross normal ROM               Cardiovascular: Normal rate and regular rhythm.                 Pulmonary/Chest: Effort normal and breath sounds without rales or wheezing.                               Neurological: Pt is alert. At baseline orientation, motor grossly intact               Skin: Skin is warm. No rashes, no other new  lesions, LE edema - none               Psychiatric: Pt behavior is normal without agitation   Micro: none  Cardiac tracings I have personally interpreted today:  none  Pertinent Radiological findings (summarize): none   Lab Results  Component Value Date   WBC 9.6 04/15/2022   HGB 13.5 04/15/2022   HCT 41.1 04/15/2022   PLT 319.0 04/15/2022   GLUCOSE 95 04/15/2022   CHOL 145 04/15/2022   TRIG 87.0 04/15/2022   HDL 59.70 04/15/2022   LDLCALC 68 04/15/2022   ALT 10 04/15/2022   AST 13 04/15/2022   NA 140 04/15/2022   K 4.0 04/15/2022   CL 105 04/15/2022   CREATININE 0.75 04/15/2022   BUN 9 04/15/2022   CO2 25 04/15/2022   TSH 1.54 04/15/2022   HGBA1C 5.4 04/15/2022   Assessment/Plan:  Lauren Mcknight is a 27 y.o. Black or African American [2] female with  has a past medical history of ALLERGIC REACTION (07/20/2008) and Allergy.  Sinusitis Mild to mod, for antibx course doxycycline 100 bid, also with unusual facial involvement and possible very small right mid face early abscess,  to f/u any worsening symptoms or concerns  B12 deficiency Lab Results  Component Value Date   VITAMINB12 126 (L) 04/15/2022   Low, remineded to start oral replacement - b12 1000 mcg qd   Low vitamin D level Last vitamin D Lab Results  Component Value Date   VD25OH 14.06 (L) 04/15/2022   Low, reminded to start oral replacement   Anxiety Overall mild chronic stable,, declines need for change in tx or referral counseling at this time  Followup: Return if symptoms worsen or fail to improve.  Oliver Barre, MD 11/13/2022 8:52 PM [facility]

## 2022-11-12 NOTE — Patient Instructions (Signed)
Please take all new medication as prescribed - the antibiotic  Please continue all other medications as before, and refills have been done if requested.  Please have the pharmacy call with any other refills you may need.  Please keep your appointments with your specialists as you may have planned   

## 2022-11-13 ENCOUNTER — Encounter: Payer: Self-pay | Admitting: Internal Medicine

## 2022-11-13 NOTE — Assessment & Plan Note (Signed)
Lab Results  Component Value Date   VITAMINB12 126 (L) 04/15/2022   Low, remineded to start oral replacement - b12 1000 mcg qd

## 2022-11-13 NOTE — Assessment & Plan Note (Signed)
Overall mild chronic stable,, declines need for change in tx or referral counseling at this time

## 2022-11-13 NOTE — Assessment & Plan Note (Signed)
Mild to mod, for antibx course doxycycline 100 bid, also with unusual facial involvement and possible very small right mid face early abscess,  to f/u any worsening symptoms or concerns

## 2022-11-13 NOTE — Assessment & Plan Note (Signed)
Last vitamin D Lab Results  Component Value Date   VD25OH 14.06 (L) 04/15/2022   Low, reminded to start oral replacement

## 2022-12-02 ENCOUNTER — Other Ambulatory Visit: Payer: Self-pay | Admitting: Internal Medicine

## 2023-04-16 ENCOUNTER — Encounter: Payer: Self-pay | Admitting: Internal Medicine

## 2023-04-16 ENCOUNTER — Ambulatory Visit (INDEPENDENT_AMBULATORY_CARE_PROVIDER_SITE_OTHER): Payer: Self-pay | Admitting: Internal Medicine

## 2023-04-16 VITALS — BP 120/74 | HR 68 | Temp 98.4°F | Ht 64.0 in | Wt 211.0 lb

## 2023-04-16 DIAGNOSIS — E611 Iron deficiency: Secondary | ICD-10-CM | POA: Diagnosis not present

## 2023-04-16 DIAGNOSIS — E538 Deficiency of other specified B group vitamins: Secondary | ICD-10-CM

## 2023-04-16 DIAGNOSIS — R7989 Other specified abnormal findings of blood chemistry: Secondary | ICD-10-CM

## 2023-04-16 DIAGNOSIS — R739 Hyperglycemia, unspecified: Secondary | ICD-10-CM

## 2023-04-16 DIAGNOSIS — Z0001 Encounter for general adult medical examination with abnormal findings: Secondary | ICD-10-CM | POA: Diagnosis not present

## 2023-04-16 DIAGNOSIS — Z202 Contact with and (suspected) exposure to infections with a predominantly sexual mode of transmission: Secondary | ICD-10-CM

## 2023-04-16 DIAGNOSIS — E7800 Pure hypercholesterolemia, unspecified: Secondary | ICD-10-CM | POA: Diagnosis not present

## 2023-04-16 DIAGNOSIS — K921 Melena: Secondary | ICD-10-CM

## 2023-04-16 DIAGNOSIS — J309 Allergic rhinitis, unspecified: Secondary | ICD-10-CM

## 2023-04-16 LAB — LIPID PANEL
Cholesterol: 155 mg/dL (ref 0–200)
HDL: 54.5 mg/dL (ref >39.00)
LDL Cholesterol: 83 mg/dL (ref 0–99)
NonHDL: 100.45
Total CHOL/HDL Ratio: 3
Triglycerides: 85 mg/dL (ref 0.0–149.0)
VLDL: 17 mg/dL (ref 0.0–40.0)

## 2023-04-16 LAB — TSH: TSH: 0.78 u[IU]/mL (ref 0.35–5.50)

## 2023-04-16 LAB — HEPATIC FUNCTION PANEL
ALT: 10 U/L (ref 0–35)
AST: 12 U/L (ref 0–37)
Albumin: 4 g/dL (ref 3.5–5.2)
Alkaline Phosphatase: 94 U/L (ref 39–117)
Bilirubin, Direct: 0 mg/dL (ref 0.0–0.3)
Total Bilirubin: 0.2 mg/dL (ref 0.2–1.2)
Total Protein: 7.1 g/dL (ref 6.0–8.3)

## 2023-04-16 LAB — BASIC METABOLIC PANEL
BUN: 11 mg/dL (ref 6–23)
CO2: 28 meq/L (ref 19–32)
Calcium: 9.4 mg/dL (ref 8.4–10.5)
Chloride: 106 meq/L (ref 96–112)
Creatinine, Ser: 0.72 mg/dL (ref 0.40–1.20)
GFR: 113 mL/min (ref >60.00)
Glucose, Bld: 104 mg/dL — ABNORMAL HIGH (ref 70–99)
Potassium: 4 meq/L (ref 3.5–5.1)
Sodium: 140 meq/L (ref 135–145)

## 2023-04-16 LAB — IBC PANEL
Iron: 89 ug/dL (ref 42–145)
Saturation Ratios: 23.3 % (ref 20.0–50.0)
TIBC: 382.2 ug/dL (ref 250.0–450.0)
Transferrin: 273 mg/dL (ref 212.0–360.0)

## 2023-04-16 LAB — HEMOGLOBIN A1C: Hgb A1c MFr Bld: 5.4 % (ref 4.6–6.5)

## 2023-04-16 LAB — VITAMIN D 25 HYDROXY (VIT D DEFICIENCY, FRACTURES): VITD: 16.8 ng/mL — ABNORMAL LOW (ref 30.00–100.00)

## 2023-04-16 LAB — VITAMIN B12: Vitamin B-12: 100 pg/mL — ABNORMAL LOW (ref 211–911)

## 2023-04-16 LAB — FERRITIN: Ferritin: 22.1 ng/mL (ref 10.0–291.0)

## 2023-04-16 MED ORDER — TRIAMCINOLONE ACETONIDE 55 MCG/ACT NA AERO: 2.0000 | INHALATION_SPRAY | Freq: Every day | NASAL | 12 refills | Status: AC

## 2023-04-16 MED ORDER — HYDROCORTISONE ACETATE 25 MG RE SUPP: 25.0000 mg | Freq: Two times a day (BID) | RECTAL | 1 refills | Status: DC

## 2023-04-16 MED ORDER — LEVOCETIRIZINE DIHYDROCHLORIDE 5 MG PO TABS: 5.0000 mg | ORAL_TABLET | Freq: Every day | ORAL | 3 refills | Status: AC | PRN

## 2023-04-16 NOTE — Progress Notes (Signed)
 Patient ID: Lauren Mcknight, female   DOB: [birth year], 28 y.o.   MRN: [id number]         Chief Complaint:: wellness exam and Medical Management of Chronic Issues (Scratchy throat was going on the last week of feb and blood work wants to get a stool test she saw blood in her stool at the beginning of feb )  , STD testing, allergies       HPI:  Lauren Mcknight is a 28 y.o. female here for wellness exam; for tdap and covid booster at the pharmacy, for hep c screening, o/w up to date               Also Pt denies chest pain, increased sob or doe, wheezing, orthopnea, PND, increased LE swelling, palpitations, dizziness or syncope.   Pt denies polydipsia, polyuria, or new focal neuro s/s.    Pt denies fever, wt loss, night sweats, loss of appetite, or other constitutional symptoms  Does have several wks ongoing nasal allergy symptoms with clearish congestion, itch and sneezing, without fever, pain, ST, cough, swelling or wheezing.  Denies worsening reflux, abd pain, dysphagia, n/v, bowel change, but has had some mild BRBPR last wk x 1 with BM with mild pain now improved.  No prior colonoscopy.  Also has had unprotected intercourse, asks for STD testing, as she can't be sure.      Wt Readings from Last 3 Encounters:  04/16/23 211 lb (95.7 kg)  11/12/22 211 lb (95.7 kg)  06/20/22 217 lb (98.4 kg)   BP Readings from Last 3 Encounters:  04/16/23 120/74  11/12/22 118/70  06/20/22 124/82   Immunization History  Administered Date(s) Administered   Influenza,inj,Quad PF,6+ Mos 11/30/2018   Influenza-Unspecified 10/10/2022   Meningococcal Conjugate 09/03/2011   PFIZER(Purple Top)SARS-COV-2 Vaccination 04/01/2019, 05/27/2019   PPD Test 12/16/2019   Tdap 09/03/2011   Health Maintenance Due  Topic Date Due   Hepatitis C Screening  Never done   DTaP/Tdap/Td (2 - Td or Tdap) 09/02/2021   COVID-19 Vaccine (3 - 2024-25 season) 10/13/2022      Past Medical History:  Diagnosis Date   ALLERGIC REACTION  07/20/2008   Qualifier: Diagnosis of  By: Shary Decamp     Allergy    Past Surgical History:  Procedure Laterality Date   APPENDECTOMY     BREAST LUMPECTOMY  2011    reports that she has never smoked. She has never used smokeless tobacco. She reports current alcohol use. She reports that she does not use drugs. family history includes Diabetes in her mother. Allergies  Allergen Reactions   Latex Anaphylaxis   Bee Venom Hives, Itching and Swelling   Clindamycin Nausea Only   Naproxen Nausea Only   Current Outpatient Medications on File Prior to Visit  Medication Sig Dispense Refill   etonogestrel-ethinyl estradiol (NUVARING) 0.12-0.015 MG/24HR vaginal ring Place 1 each vaginally every 28 (twenty-eight) days. Insert vaginally and leave in place for 3 consecutive weeks, then remove for 1 week.     etonogestrel-ethinyl estradiol (NUVARING) 0.12-0.015 MG/24HR vaginal ring Place 1 each vaginally every 28 (twenty-eight) days. Insert vaginally and leave in place for 3 consecutive weeks, then remove for 1 week. 3 each 4   valACYclovir (VALTREX) 500 MG tablet Take 500 mg by mouth 2 (two) times daily.     Vitamins-Lipotropics (MULTI-VITAMIN HP/MINERALS) CAPS Take by mouth.     doxycycline (VIBRA-TABS) 100 MG tablet Take 1 tablet (100 mg total) by mouth 2 (two) times  daily. (Patient not taking: Reported on 04/16/2023) 20 tablet 0   meloxicam (MOBIC) 15 MG tablet Take 1 tablet (15 mg total) by mouth daily as needed for pain. (Patient not taking: Reported on 04/16/2023) 90 tablet 1   predniSONE (DELTASONE) 20 MG tablet Take 2 tablets (40 mg total) by mouth daily with breakfast. (Patient not taking: Reported on 04/16/2023) 10 tablet 0   No current facility-administered medications on file prior to visit.        ROS:  All others reviewed and negative.  Objective        PE:  BP 120/74 (BP Location: Left Arm, Patient Position: Sitting, Cuff Size: Normal)   Pulse 68   Temp 98.4 F (36.9 C) (Oral)   Ht 5'  4" (1.626 m)   Wt 211 lb (95.7 kg)   LMP 04/11/2023 (Exact Date)   SpO2 98%   BMI 36.22 kg/m                 Constitutional: Pt appears in NAD               HENT: Head: NCAT.                Right Ear: External ear normal.                 Left Ear: External ear normal. Bilat tm's with mild erythema.  Max sinus areas non tender.  Pharynx with mild erythema, no exudate                Eyes: . Pupils are equal, round, and reactive to light. Conjunctivae and EOM are normal               Nose: without d/c or deformity               Neck: Neck supple. Gross normal ROM               Cardiovascular: Normal rate and regular rhythm.                 Pulmonary/Chest: Effort normal and breath sounds without rales or wheezing.                Abd:  Soft, NT, ND, + BS, no organomegaly               Neurological: Pt is alert. At baseline orientation, motor grossly intact               Skin: Skin is warm. No rashes, no other new lesions, LE edema - none               Psychiatric: Pt behavior is normal without agitation   Micro: none  Cardiac tracings I have personally interpreted today:  none  Pertinent Radiological findings (summarize): none   Lab Results  Component Value Date   WBC 10.0 04/16/2023   HGB 13.8 04/16/2023   HCT 42.1 04/16/2023   PLT 303.0 04/16/2023   GLUCOSE 104 (H) 04/16/2023   CHOL 155 04/16/2023   TRIG 85.0 04/16/2023   HDL 54.50 04/16/2023   LDLCALC 83 04/16/2023   ALT 10 04/16/2023   AST 12 04/16/2023   NA 140 04/16/2023   K 4.0 04/16/2023   CL 106 04/16/2023   CREATININE 0.72 04/16/2023   BUN 11 04/16/2023   CO2 28 04/16/2023   TSH 0.78 04/16/2023   HGBA1C 5.4 04/16/2023   Assessment/Plan:  Lauren Mcknight is a 28 y.o.  Black or African American [2] female with  has a past medical history of ALLERGIC REACTION (07/20/2008) and Allergy.  Encounter for well adult exam with abnormal findings Age and sex appropriate education and counseling updated with regular exercise  and diet Referrals for preventative services - for hep c screening Immunizations addressed - for tdap and covid booster at pharmacy Smoking counseling  - none needed Evidence for depression or other mood disorder - none significant Most recent labs reviewed. I have personally reviewed and have noted: 1) the patient's medical and social history 2) The patient's current medications and supplements 3) The patient's height, weight, and BMI have been recorded in the chart   Allergic rhinitis Mild to mod, for xyzal 5 mg and nasacort asd,,  to f/u any worsening symptoms or concerns  B12 deficiency Lab Results  Component Value Date   VITAMINB12 100 (L) 04/16/2023   Low, to start oral replacement - b12 1000 mcg qd   Hematochezia Mild, very low likelihood of malignancy, pt declines GI referral, now for anusol hc prn  Low vitamin D level Last vitamin D Lab Results  Component Value Date   VD25OH 16.80 (L) 04/16/2023   Low, to start oral replacement   STD exposure Also for STD testing per pt request  Followup: Return in about 1 year (around 04/15/2024).  Oliver Barre, MD 04/19/2023 9:03 PM [facility]  Primary Care - [facility] Internal Medicine

## 2023-04-16 NOTE — Patient Instructions (Signed)
 Please take all new medication as prescribed  - the xyzal and nasacort for allergies, and anusol HC supp as needed  Please continue all other medications as before, and refills have been done if requested.  Please have the pharmacy call with any other refills you may need.  Please continue your efforts at being more active, low cholesterol diet, and weight control.  You are otherwise up to date with prevention measures today.  Please keep your appointments with your specialists as you may have planned  Please go to the LAB at the blood drawing area for the tests to be done  You will be contacted by phone if any changes need to be made immediately.  Otherwise, you will receive a letter about your results with an explanation, but please check with MyChart first.  Please make an Appointment to return for your 1 year visit, or sooner if needed

## 2023-04-17 ENCOUNTER — Encounter: Payer: Self-pay | Admitting: Internal Medicine

## 2023-04-17 LAB — CBC WITH DIFFERENTIAL/PLATELET
Basophils Absolute: 0.1 10*3/uL (ref 0.0–0.1)
Basophils Relative: 0.8 % (ref 0.0–3.0)
Eosinophils Absolute: 0.1 10*3/uL (ref 0.0–0.7)
Eosinophils Relative: 1 % (ref 0.0–5.0)
HCT: 42.1 % (ref 36.0–46.0)
Hemoglobin: 13.8 g/dL (ref 12.0–15.0)
Lymphocytes Relative: 32.9 % (ref 12.0–46.0)
Lymphs Abs: 3.3 10*3/uL (ref 0.7–4.0)
MCHC: 32.7 g/dL (ref 30.0–36.0)
MCV: 91.2 fl (ref 78.0–100.0)
Monocytes Absolute: 0.5 10*3/uL (ref 0.1–1.0)
Monocytes Relative: 4.7 % (ref 3.0–12.0)
Neutro Abs: 6.1 10*3/uL (ref 1.4–7.7)
Neutrophils Relative %: 60.6 % (ref 43.0–77.0)
Platelets: 303 10*3/uL (ref 150.0–400.0)
RBC: 4.61 Mil/uL (ref 3.87–5.11)
RDW: 13.3 % (ref 11.5–15.5)
WBC: 10 10*3/uL (ref 4.0–10.5)

## 2023-04-17 LAB — URINALYSIS, ROUTINE W REFLEX MICROSCOPIC
Bilirubin Urine: NEGATIVE
Hgb urine dipstick: NEGATIVE
Leukocytes,Ua: NEGATIVE
Nitrite: NEGATIVE
RBC / HPF: NONE SEEN (ref 0–?)
Specific Gravity, Urine: 1.02 (ref 1.000–1.030)
Total Protein, Urine: NEGATIVE
Urine Glucose: NEGATIVE
Urobilinogen, UA: 0.2 (ref 0.0–1.0)
pH: 6.5 (ref 5.0–8.0)

## 2023-04-17 LAB — RPR: RPR Ser Ql: NONREACTIVE

## 2023-04-17 LAB — HIV ANTIBODY (ROUTINE TESTING W REFLEX): HIV 1&2 Ab, 4th Generation: NONREACTIVE

## 2023-04-17 LAB — HSV 2 ANTIBODY, IGG: HSV 2 Glycoprotein G Ab, IgG: <0.9 {index}

## 2023-04-17 NOTE — Progress Notes (Signed)
 The test results show that your current treatment is OK, as the tests are stable, except the Vit D and B12 levels are low.  Please take OTC Vitamin D3 at 2000 units per day, indefinitely, and also start B12 1000 mcg per day for 6 months. Otherwise.  Please continue the same plan.  The STD testing is pending.   There is no other need for change of treatment or further evaluation based on these results, at this time.  thanks

## 2023-04-17 NOTE — Progress Notes (Signed)
 The test results show that your current treatment is OK, as the tests are stable.  Please continue the same plan.  There is no other need for change of treatment or further evaluation based on these results, at this time.  thanks

## 2023-04-19 ENCOUNTER — Encounter: Payer: Self-pay | Admitting: Internal Medicine

## 2023-04-19 LAB — GC/CHLAMYDIA PROBE AMP
Chlamydia trachomatis, NAA: NEGATIVE
Neisseria Gonorrhoeae by PCR: NEGATIVE

## 2023-04-19 NOTE — Assessment & Plan Note (Signed)
 Lab Results  Component Value Date   VITAMINB12 100 (L) 04/16/2023   Low, to start oral replacement - b12 1000 mcg qd

## 2023-04-19 NOTE — Assessment & Plan Note (Signed)
 Last vitamin D Lab Results  Component Value Date   VD25OH 16.80 (L) 04/16/2023   Low, to start oral replacement

## 2023-04-19 NOTE — Assessment & Plan Note (Signed)
 Mild, very low likelihood of malignancy, pt declines GI referral, now for anusol hc prn

## 2023-04-19 NOTE — Assessment & Plan Note (Signed)
 Age and sex appropriate education and counseling updated with regular exercise and diet Referrals for preventative services - for hep c screening Immunizations addressed - for tdap and covid booster at pharmacy Smoking counseling  - none needed Evidence for depression or other mood disorder - none significant Most recent labs reviewed. I have personally reviewed and have noted: 1) the patient's medical and social history 2) The patient's current medications and supplements 3) The patient's height, weight, and BMI have been recorded in the chart

## 2023-04-19 NOTE — Assessment & Plan Note (Signed)
 Mild to mod, for xyzal 5 mg and nasacort asd,,  to f/u any worsening symptoms or concerns

## 2023-04-19 NOTE — Assessment & Plan Note (Signed)
Also for STD testing per pt request

## 2023-06-19 ENCOUNTER — Ambulatory Visit: Admitting: Internal Medicine

## 2023-06-19 VITALS — BP 120/70 | HR 75 | Temp 98.5°F | Ht 64.0 in | Wt 210.0 lb

## 2023-06-19 DIAGNOSIS — E538 Deficiency of other specified B group vitamins: Secondary | ICD-10-CM | POA: Diagnosis not present

## 2023-06-19 DIAGNOSIS — R7989 Other specified abnormal findings of blood chemistry: Secondary | ICD-10-CM | POA: Diagnosis not present

## 2023-06-19 DIAGNOSIS — J309 Allergic rhinitis, unspecified: Secondary | ICD-10-CM

## 2023-06-19 DIAGNOSIS — J329 Chronic sinusitis, unspecified: Secondary | ICD-10-CM | POA: Diagnosis not present

## 2023-06-19 MED ORDER — AZITHROMYCIN 250 MG PO TABS: ORAL_TABLET | ORAL | 1 refills | Status: AC

## 2023-06-19 MED ORDER — HYDROCOD POLI-CHLORPHE POLI ER 10-8 MG/5ML PO SUER: 5.0000 mL | Freq: Two times a day (BID) | ORAL | 0 refills | Status: DC | PRN

## 2023-06-19 NOTE — Assessment & Plan Note (Signed)
 Last vitamin D Lab Results  Component Value Date   VD25OH 16.80 (L) 04/16/2023   Low, to start oral replacement

## 2023-06-19 NOTE — Assessment & Plan Note (Signed)
 Mild to mod, for antibx course zpack, tussionex prn cough,  to f/u any worsening symptoms or concerns

## 2023-06-19 NOTE — Assessment & Plan Note (Signed)
 Mild to mod, for restart xyzal  5 mg every day, nasacort  asd,  to f/u any worsening symptoms or concerns

## 2023-06-19 NOTE — Progress Notes (Signed)
 Patient ID: Lauren Mcknight, female   DOB: March 09, 1995, 28 y.o.   MRN: [id number]        Chief Complaint: follow up sinusitis acute, allergies, low b12 and D       HPI:  Lauren Mcknight is a 28 y.o. female  Here with 2-3 days acute onset fever, facial pain, pressure, headache, general weakness and malaise, and greenish d/c, with mild ST and cough, but pt denies chest pain, wheezing, increased sob or doe, orthopnea, PND, increased LE swelling, palpitations, dizziness or syncope.  Does have several wks ongoing nasal allergy symptoms with clearish congestion, itch and sneezing, without fever, pain, ST, cough, swelling or wheezing.  Pt denies polydipsia, polyuria, or new focal neuro s/s.         Wt Readings from Last 3 Encounters:  06/19/23 210 lb (95.3 kg)  04/16/23 211 lb (95.7 kg)  11/12/22 211 lb (95.7 kg)   BP Readings from Last 3 Encounters:  06/19/23 120/70  04/16/23 120/74  11/12/22 118/70         Past Medical History:  Diagnosis Date   ALLERGIC REACTION 07/20/2008   Qualifier: Diagnosis of  By: Joen Muskrat     Allergy    Past Surgical History:  Procedure Laterality Date   APPENDECTOMY     BREAST LUMPECTOMY  2011    reports that she has never smoked. She has never used smokeless tobacco. She reports current alcohol use. She reports that she does not use drugs. family history includes Diabetes in her mother. Allergies  Allergen Reactions   Latex Anaphylaxis   Bee Venom Hives, Itching and Swelling   Clindamycin Nausea Only   Naproxen  Nausea Only   Current Outpatient Medications on File Prior to Visit  Medication Sig Dispense Refill   etonogestrel -ethinyl estradiol  (NUVARING) 0.12-0.015 MG/24HR vaginal ring Place 1 each vaginally every 28 (twenty-eight) days. Insert vaginally and leave in place for 3 consecutive weeks, then remove for 1 week.     etonogestrel -ethinyl estradiol  (NUVARING) 0.12-0.015 MG/24HR vaginal ring Place 1 each vaginally every 28 (twenty-eight) days. Insert  vaginally and leave in place for 3 consecutive weeks, then remove for 1 week. 3 each 4   fluconazole  (DIFLUCAN ) 150 MG tablet Take 1 tablet every 72 hours by oral route.     levocetirizine (XYZAL ) 5 MG tablet Take 1 tablet (5 mg total) by mouth daily as needed for allergies. 90 tablet 3   nystatin (MYCOSTATIN/NYSTOP) powder APPLY TO THE AFFECTED AREA(S) BY TOPICAL ROUTE 2 TIMES PER DAY     triamcinolone  (NASACORT ) 55 MCG/ACT AERO nasal inhaler Place 2 sprays into the nose daily. 1 each 12   valACYclovir  (VALTREX ) 500 MG tablet Take 500 mg by mouth 2 (two) times daily.     No current facility-administered medications on file prior to visit.        ROS:  All others reviewed and negative.  Objective        PE:  BP 120/70 (BP Location: Left Arm, Patient Position: Sitting, Cuff Size: Normal)   Pulse 75   Temp 98.5 F (36.9 C) (Oral)   Ht 5\' 4"  (1.626 m)   Wt 210 lb (95.3 kg)   LMP 06/08/2023 (Exact Date)   SpO2 99%   BMI 36.05 kg/m                 Constitutional: Pt appears in NAD               HENT: Head: NCAT.  Right Ear: External ear normal.                 Left Ear: External ear normal. Bilat tm's with mild erythema.  Max sinus areas mild tender.  Pharynx with mild erythema, no exudate               Eyes: . Pupils are equal, round, and reactive to light. Conjunctivae and EOM are normal               Nose: without d/c or deformity               Neck: Neck supple. Gross normal ROM               Cardiovascular: Normal rate and regular rhythm.                 Pulmonary/Chest: Effort normal and breath sounds without rales or wheezing.                               Neurological: Pt is alert. At baseline orientation, motor grossly intact               Skin: Skin is warm. No rashes, no other new lesions, LE edema - none               Psychiatric: Pt behavior is normal without agitation   Micro: none  Cardiac tracings I have personally interpreted today:  none  Pertinent  Radiological findings (summarize): none   Lab Results  Component Value Date   WBC 10.0 04/16/2023   HGB 13.8 04/16/2023   HCT 42.1 04/16/2023   PLT 303.0 04/16/2023   GLUCOSE 104 (H) 04/16/2023   CHOL 155 04/16/2023   TRIG 85.0 04/16/2023   HDL 54.50 04/16/2023   LDLCALC 83 04/16/2023   ALT 10 04/16/2023   AST 12 04/16/2023   NA 140 04/16/2023   K 4.0 04/16/2023   CL 106 04/16/2023   CREATININE 0.72 04/16/2023   BUN 11 04/16/2023   CO2 28 04/16/2023   TSH 0.78 04/16/2023   HGBA1C 5.4 04/16/2023   Assessment/Plan:  Lauren Mcknight is a 28 y.o. Black or African American [2] female with  has a past medical history of ALLERGIC REACTION (07/20/2008) and Allergy.  Allergic rhinitis Mild to mod, for restart xyzal  5 mg every day, nasacort  asd,  to f/u any worsening symptoms or concerns  Low vitamin D  level Last vitamin D  Lab Results  Component Value Date   VD25OH 16.80 (L) 04/16/2023   Low, to start oral replacement   B12 deficiency Lab Results  Component Value Date   VITAMINB12 100 (L) 04/16/2023   Low, to start oral replacement - b12 1000 mcg qd   Sinusitis Mild to mod, for antibx course zpack, tussionex prn cough,  to f/u any worsening symptoms or concerns  Followup: Return if symptoms worsen or fail to improve.  Rosalia Colonel, MD 06/19/2023 8:11 PM [facility] [place] Primary Care - [facility] Internal Medicine

## 2023-06-19 NOTE — Patient Instructions (Signed)
Please take all new medication as prescribed - the antibiotic, and cough medicine  Please continue all other medications as before, and refills have been done if requested.  Please have the pharmacy call with any other refills you may need.  Please keep your appointments with your specialists as you may have planned      

## 2023-06-19 NOTE — Assessment & Plan Note (Signed)
 Lab Results  Component Value Date   VITAMINB12 100 (L) 04/16/2023   Low, to start oral replacement - b12 1000 mcg qd

## 2023-07-14 ENCOUNTER — Other Ambulatory Visit: Payer: Self-pay | Admitting: Obstetrics & Gynecology

## 2023-07-14 DIAGNOSIS — R1011 Right upper quadrant pain: Secondary | ICD-10-CM

## 2023-07-15 ENCOUNTER — Ambulatory Visit
Admission: RE | Admit: 2023-07-15 | Discharge: 2023-07-15 | Disposition: A | Discharge status: Home / Self Care | Source: Ambulatory Visit | Attending: Obstetrics & Gynecology | Admitting: Obstetrics & Gynecology

## 2023-07-15 DIAGNOSIS — R1011 Right upper quadrant pain: Secondary | ICD-10-CM

## 2023-08-13 ENCOUNTER — Ambulatory Visit (INDEPENDENT_AMBULATORY_CARE_PROVIDER_SITE_OTHER): Admitting: Internal Medicine

## 2023-08-13 ENCOUNTER — Ambulatory Visit: Payer: Self-pay | Admitting: Internal Medicine

## 2023-08-13 ENCOUNTER — Encounter: Payer: Self-pay | Admitting: Internal Medicine

## 2023-08-13 ENCOUNTER — Telehealth: Payer: Self-pay | Admitting: Internal Medicine

## 2023-08-13 VITALS — BP 110/78 | HR 81 | Temp 98.1°F | Ht 64.0 in | Wt 212.6 lb

## 2023-08-13 DIAGNOSIS — J309 Allergic rhinitis, unspecified: Secondary | ICD-10-CM | POA: Diagnosis not present

## 2023-08-13 DIAGNOSIS — R7989 Other specified abnormal findings of blood chemistry: Secondary | ICD-10-CM

## 2023-08-13 DIAGNOSIS — E538 Deficiency of other specified B group vitamins: Secondary | ICD-10-CM

## 2023-08-13 DIAGNOSIS — N912 Amenorrhea, unspecified: Secondary | ICD-10-CM | POA: Diagnosis not present

## 2023-08-13 DIAGNOSIS — J329 Chronic sinusitis, unspecified: Secondary | ICD-10-CM | POA: Diagnosis not present

## 2023-08-13 LAB — HCG, QUANTITATIVE, PREGNANCY: Quantitative HCG: 0.63 m[IU]/mL

## 2023-08-13 MED ORDER — HYDROCODONE BIT-HOMATROP MBR 5-1.5 MG/5ML PO SOLN: 5.0000 mL | Freq: Four times a day (QID) | ORAL | 0 refills | Status: DC | PRN

## 2023-08-13 MED ORDER — CEPHALEXIN 500 MG PO CAPS: 500.0000 mg | ORAL_CAPSULE | Freq: Three times a day (TID) | ORAL | 0 refills | Status: AC

## 2023-08-13 NOTE — Assessment & Plan Note (Signed)
 Last vitamin D Lab Results  Component Value Date   VD25OH 16.80 (L) 04/16/2023   Low, to start oral replacement

## 2023-08-13 NOTE — Assessment & Plan Note (Signed)
 Also for pregnancy testing today

## 2023-08-13 NOTE — Telephone Encounter (Unsigned)
 Copied from CRM [id number]. Topic: General - Other >> Aug 13, 2023  4:58 PM Gennette ORN wrote: Reason for CRM: Patient forgot to tell dr she wants the yellow one instead of red ones of chlorpheniramine-HYDROcodone  (TUSSIONEX) 10-8 MG/5ML. She likes those better than the red ones.

## 2023-08-13 NOTE — Assessment & Plan Note (Signed)
 Lab Results  Component Value Date   VITAMINB12 100 (L) 04/16/2023   Low, to start oral replacement - b12 1000 mcg qd

## 2023-08-13 NOTE — Assessment & Plan Note (Signed)
 Mild to mod, for restart xyzal  5 mg every day prn,  to f/u any worsening symptoms or concerns

## 2023-08-13 NOTE — Progress Notes (Signed)
 Patient ID: Lauren Mcknight, female   DOB: 01/03/1996, 28 y.o.   MRN: [id number]        Chief Complaint: follow up sinusitis, allergies, low vit d and b12       HPI:  Lauren Mcknight is a 28 y.o. female  Here with 2-3 days acute onset fever, facial pain, pressure, headache, general weakness and malaise, and greenish d/c, with mild ST and cough, but pt denies chest pain, wheezing, increased sob or doe, orthopnea, PND, increased LE swelling, palpitations, dizziness or syncope.  Does have several wks ongoing nasal allergy symptoms with clearish congestion, itch and sneezing, without fever, pain, ST, cough, swelling or wheezing.  Did have none or very small menses in June, asking for pregnancy testing         Wt Readings from Last 3 Encounters:  08/13/23 212 lb 9.6 oz (96.4 kg)  06/19/23 210 lb (95.3 kg)  04/16/23 211 lb (95.7 kg)   BP Readings from Last 3 Encounters:  08/13/23 110/78  06/19/23 120/70  04/16/23 120/74         Past Medical History:  Diagnosis Date   ALLERGIC REACTION 07/20/2008   Qualifier: Diagnosis of  By: Lavell Orf     Allergy    Past Surgical History:  Procedure Laterality Date   APPENDECTOMY     BREAST LUMPECTOMY  2011    reports that she has never smoked. She has never used smokeless tobacco. She reports current alcohol use. She reports that she does not use drugs. family history includes Diabetes in her mother. Allergies  Allergen Reactions   Latex Anaphylaxis   Bee Venom Hives, Itching and Swelling   Clindamycin Nausea Only   Naproxen  Nausea Only   Current Outpatient Medications on File Prior to Visit  Medication Sig Dispense Refill   chlorpheniramine-HYDROcodone  (TUSSIONEX) 10-8 MG/5ML Take 5 mLs by mouth every 12 (twelve) hours as needed for cough. 115 mL 0   etonogestrel -ethinyl estradiol  (NUVARING) 0.12-0.015 MG/24HR vaginal ring Place 1 each vaginally every 28 (twenty-eight) days. Insert vaginally and leave in place for 3 consecutive weeks, then remove  for 1 week.     etonogestrel -ethinyl estradiol  (NUVARING) 0.12-0.015 MG/24HR vaginal ring Place 1 each vaginally every 28 (twenty-eight) days. Insert vaginally and leave in place for 3 consecutive weeks, then remove for 1 week. 3 each 4   fluconazole  (DIFLUCAN ) 150 MG tablet Take 1 tablet every 72 hours by oral route.     levocetirizine (XYZAL ) 5 MG tablet Take 1 tablet (5 mg total) by mouth daily as needed for allergies. 90 tablet 3   nystatin (MYCOSTATIN/NYSTOP) powder APPLY TO THE AFFECTED AREA(S) BY TOPICAL ROUTE 2 TIMES PER DAY     triamcinolone  (NASACORT ) 55 MCG/ACT AERO nasal inhaler Place 2 sprays into the nose daily. 1 each 12   valACYclovir  (VALTREX ) 500 MG tablet Take 500 mg by mouth 2 (two) times daily.     No current facility-administered medications on file prior to visit.        ROS:  All others reviewed and negative.  Objective        PE:  BP 110/78 (BP Location: Left Arm, Patient Position: Sitting)   Pulse 81   Temp 98.1 F (36.7 C) (Temporal)   Ht 5' 4 (1.626 m)   Wt 212 lb 9.6 oz (96.4 kg)   SpO2 96%   BMI 36.49 kg/m                 Constitutional: Pt  appears in NAD               HENT: Head: NCAT.                Right Ear: External ear normal.                 Left Ear: External ear normal. Bilat tm's with mild erythema.  Max sinus areas mild tender.  Pharynx with mild erythema, no exudate                Eyes: . Pupils are equal, round, and reactive to light. Conjunctivae and EOM are normal               Nose: without d/c or deformity               Neck: Neck supple. Gross normal ROM               Cardiovascular: Normal rate and regular rhythm.                 Pulmonary/Chest: Effort normal and breath sounds without rales or wheezing.                Abd:  Soft, NT, ND, + BS, no organomegaly               Neurological: Pt is alert. At baseline orientation, motor grossly intact               Skin: Skin is warm. No rashes, no other new lesions, LE edema - none                Psychiatric: Pt behavior is normal without agitation   Micro: none  Cardiac tracings I have personally interpreted today:  none  Pertinent Radiological findings (summarize): none   Lab Results  Component Value Date   WBC 10.0 04/16/2023   HGB 13.8 04/16/2023   HCT 42.1 04/16/2023   PLT 303.0 04/16/2023   GLUCOSE 104 (H) 04/16/2023   CHOL 155 04/16/2023   TRIG 85.0 04/16/2023   HDL 54.50 04/16/2023   LDLCALC 83 04/16/2023   ALT 10 04/16/2023   AST 12 04/16/2023   NA 140 04/16/2023   K 4.0 04/16/2023   CL 106 04/16/2023   CREATININE 0.72 04/16/2023   BUN 11 04/16/2023   CO2 28 04/16/2023   TSH 0.78 04/16/2023   HGBA1C 5.4 04/16/2023   Assessment/Plan:  Lauren Mcknight is a 28 y.o. Black or African American [2] female with  has a past medical history of ALLERGIC REACTION (07/20/2008) and Allergy.  Allergic rhinitis Mild to mod, for restart xyzal  5 mg every day prn,  to f/u any worsening symptoms or concerns   Sinusitis Mild to mod, for antibx course cephalexin course, cough med prn,  to f/u any worsening symptoms or concerns  Low vitamin D  level Last vitamin D  Lab Results  Component Value Date   VD25OH 16.80 (L) 04/16/2023   Low, to start oral replacement   B12 deficiency Lab Results  Component Value Date   VITAMINB12 100 (L) 04/16/2023   Low, to start oral replacement - b12 1000 mcg qd   Amenorrhea Also for pregnancy testing today  Followup: Return if symptoms worsen or fail to improve.  Lynwood Rush, MD 08/13/2023 1:50 PM  [facility] [place] [facility] - [facility] Internal Medicine

## 2023-08-13 NOTE — Assessment & Plan Note (Signed)
 Mild to mod, for antibx course cephalexin course, cough med prn,  to f/u any worsening symptoms or concerns

## 2023-08-13 NOTE — Patient Instructions (Signed)
 Please take all new medication as prescribed- the antibiotic, and cough medicine as needed  Please continue all other medications as before, and refills have been done if requested.  Please have the pharmacy call with any other refills you may need.  Please keep your appointments with your specialists as you may have planned  Please go to the LAB at the blood drawing area for the tests to be done - just the pregnancy testing today  You will be contacted by phone if any changes need to be made immediately.  Otherwise, you will receive a letter about your results with an explanation, but please check with MyChart first.

## 2023-08-14 MED ORDER — HYDROCOD POLI-CHLORPHE POLI ER 10-8 MG/5ML PO SUER: 5.0000 mL | Freq: Two times a day (BID) | ORAL | 0 refills | Status: DC | PRN

## 2023-08-14 NOTE — Telephone Encounter (Signed)
 Ok this is done

## 2024-02-20 ENCOUNTER — Ambulatory Visit: Admitting: Internal Medicine

## 2024-02-20 ENCOUNTER — Encounter: Payer: Self-pay | Admitting: Internal Medicine

## 2024-02-20 VITALS — BP 124/80 | HR 67 | Temp 98.4°F | Ht 64.0 in | Wt 209.0 lb

## 2024-02-20 DIAGNOSIS — R051 Acute cough: Secondary | ICD-10-CM | POA: Diagnosis not present

## 2024-02-20 DIAGNOSIS — E538 Deficiency of other specified B group vitamins: Secondary | ICD-10-CM | POA: Diagnosis not present

## 2024-02-20 DIAGNOSIS — R7989 Other specified abnormal findings of blood chemistry: Secondary | ICD-10-CM

## 2024-02-20 DIAGNOSIS — R059 Cough, unspecified: Secondary | ICD-10-CM | POA: Insufficient documentation

## 2024-02-20 MED ORDER — DOXYCYCLINE HYCLATE 100 MG PO TABS: 100.0000 mg | ORAL_TABLET | Freq: Two times a day (BID) | ORAL | 0 refills | Status: AC

## 2024-02-20 MED ORDER — HYDROCOD POLI-CHLORPHE POLI ER 10-8 MG/5ML PO SUER: 5.0000 mL | Freq: Two times a day (BID) | ORAL | 0 refills | Status: AC | PRN

## 2024-02-20 NOTE — Assessment & Plan Note (Signed)
 Lab Results  Component Value Date   VITAMINB12 100 (L) 04/16/2023   Low, reminded to start oral replacement - b12 1000 mcg qd

## 2024-02-20 NOTE — Assessment & Plan Note (Signed)
 Last vitamin D Lab Results  Component Value Date   VD25OH 16.80 (L) 04/16/2023   Low, to start oral replacement

## 2024-02-20 NOTE — Assessment & Plan Note (Signed)
Mild to mod, c/w bronchitis vs pna, declines cxr, for antibx course doxycyline 100 bid, cough med prn,  to f/u any worsening symptoms or concerns

## 2024-02-20 NOTE — Patient Instructions (Signed)
 Please take all new medication as prescribed - the antibiotic, and cough medicine  Please continue all other medications as before, and refills have been done if requested.  Please have the pharmacy call with any other refills you may need.  Please keep your appointments with your specialists as you may have planned  Good luck to you in future!

## 2024-02-20 NOTE — Progress Notes (Signed)
 Patient ID: Lauren Mcknight, female   DOB: 10-22-1995, 29 y.o.   MRN: [id number]        Chief Complaint: follow up cough, wheezing, low vit d       HPI:  Lauren Mcknight is a 29 y.o. female Here with acute onset mild to mod 2-3 days ST, HA, general weakness and malaise, with prod cough greenish sputum with very mild wheeziness but no sob, doe, but Pt denies chest pain, orthopnea, PND, increased LE swelling, palpitations, dizziness or syncope.   Pt denies polydipsia, polyuria, or new focal neuro s/s.         Wt Readings from Last 3 Encounters:  02/20/24 209 lb (94.8 kg)  08/13/23 212 lb 9.6 oz (96.4 kg)  06/19/23 210 lb (95.3 kg)   BP Readings from Last 3 Encounters:  02/20/24 124/80  08/13/23 110/78  06/19/23 120/70         Past Medical History:  Diagnosis Date   ALLERGIC REACTION 07/20/2008   Qualifier: Diagnosis of  By: Lavell Orf     Allergy    Past Surgical History:  Procedure Laterality Date   APPENDECTOMY     BREAST LUMPECTOMY  2011    reports that she has never smoked. She has never used smokeless tobacco. She reports current alcohol use. She reports that she does not use drugs. family history includes Diabetes in her mother. Allergies[1] Medications Ordered Prior to Encounter[2]      ROS:  All others reviewed and negative.  Objective        PE:  BP 124/80 (BP Location: Left Arm, Patient Position: Sitting, Cuff Size: Normal)   Pulse 67   Temp 98.4 F (36.9 C) (Oral)   Ht 5' 4 (1.626 m)   Wt 209 lb (94.8 kg)   LMP 02/10/2024 (Exact Date)   SpO2 98%   BMI 35.87 kg/m                 Constitutional: Pt appears mild ill               HENT: Head: NCAT.                Right Ear: External ear normal.                 Left Ear: External ear normal. Bilat tm's with mild erythema.  Max sinus areas non tender.  Pharynx with mild erythema, no exudate                 Eyes: . Pupils are equal, round, and reactive to light. Conjunctivae and EOM are normal               Nose:  without d/c or deformity               Neck: Neck supple. Gross normal ROM               Cardiovascular: Normal rate and regular rhythm.                 Pulmonary/Chest: Effort normal and breath sounds decreased without rales or overt wheezing.                Neurological: Pt is alert. At baseline orientation, motor grossly intact               Skin: Skin is warm. No rashes, no other new lesions, LE edema - none  Psychiatric: Pt behavior is normal without agitation   Micro: none  Cardiac tracings I have personally interpreted today:  none  Pertinent Radiological findings (summarize): none   Lab Results  Component Value Date   WBC 10.0 04/16/2023   HGB 13.8 04/16/2023   HCT 42.1 04/16/2023   PLT 303.0 04/16/2023   GLUCOSE 104 (H) 04/16/2023   CHOL 155 04/16/2023   TRIG 85.0 04/16/2023   HDL 54.50 04/16/2023   LDLCALC 83 04/16/2023   ALT 10 04/16/2023   AST 12 04/16/2023   NA 140 04/16/2023   K 4.0 04/16/2023   CL 106 04/16/2023   CREATININE 0.72 04/16/2023   BUN 11 04/16/2023   CO2 28 04/16/2023   TSH 0.78 04/16/2023   HGBA1C 5.4 04/16/2023   Assessment/Plan:  Lauren Mcknight is a 29 y.o. Black or African American [2] female with  has a past medical history of ALLERGIC REACTION (07/20/2008) and Allergy.  Low vitamin D  level Last vitamin D  Lab Results  Component Value Date   VD25OH 16.80 (L) 04/16/2023   Low, to start oral replacement   Cough Mild to mod, c/w bronchitis vs pna, declines cxr, for antibx course doxycyline 100 bid, cough med prn,  to f/u any worsening symptoms or concerns  B12 deficiency Lab Results  Component Value Date   VITAMINB12 100 (L) 04/16/2023   Low, reminded to start oral replacement - b12 1000 mcg qd  Followup: Return if symptoms worsen or fail to improve.  Lynwood Rush, MD 02/20/2024 7:23 PM [facility]     [1]  Allergies Allergen Reactions   Latex  Anaphylaxis   Bee Venom Hives, Itching and Swelling   Clindamycin Nausea Only   Naproxen  Nausea Only  [2]  Current Outpatient Medications on File Prior to Visit  Medication Sig Dispense Refill   etonogestrel -ethinyl estradiol  (NUVARING) 0.12-0.015 MG/24HR vaginal ring Place 1 each vaginally every 28 (twenty-eight) days. Insert vaginally and leave in place for 3 consecutive weeks, then remove for 1 week.     etonogestrel -ethinyl estradiol  (NUVARING) 0.12-0.015 MG/24HR vaginal ring Place 1 each vaginally every 28 (twenty-eight) days. Insert vaginally and leave in place for 3 consecutive weeks, then remove for 1 week. 3 each 4   fluconazole  (DIFLUCAN ) 150 MG tablet Take 1 tablet every 72 hours by oral route.     levocetirizine (XYZAL ) 5 MG tablet Take 1 tablet (5 mg total) by mouth daily as needed for allergies. 90 tablet 3   nystatin (MYCOSTATIN/NYSTOP) powder APPLY TO THE AFFECTED AREA(S) BY TOPICAL ROUTE 2 TIMES PER DAY     triamcinolone  (NASACORT ) 55 MCG/ACT AERO nasal inhaler Place 2 sprays into the nose daily. 1 each 12   valACYclovir  (VALTREX ) 500 MG tablet Take 500 mg by mouth 2 (two) times daily.     cephALEXin  (KEFLEX ) 500 MG capsule Take 1 capsule (500 mg total) by mouth 3 (three) times daily. (Patient not taking: Reported on 02/20/2024) 30 capsule 0   No current facility-administered medications on file prior to visit.

## 2024-02-26 ENCOUNTER — Ambulatory Visit: Admitting: Internal Medicine
